# Patient Record
Sex: Male | Born: 1945 | Race: White | Hispanic: No | Marital: Married | Smoking: Former smoker
Health system: Southern US, Community
[De-identification: ages and names within clinical notes are randomized; demographics above are authoritative.]

## PROBLEM LIST (undated history)

## (undated) DIAGNOSIS — E7800 Pure hypercholesterolemia, unspecified: Secondary | ICD-10-CM

## (undated) DIAGNOSIS — I219 Acute myocardial infarction, unspecified: Secondary | ICD-10-CM

## (undated) DIAGNOSIS — I10 Essential (primary) hypertension: Secondary | ICD-10-CM

## (undated) HISTORY — PX: CARDIAC CATHETERIZATION: SHX172

## (undated) HISTORY — PX: KIDNEY STONE SURGERY: SHX686

## (undated) HISTORY — PX: OTHER SURGICAL HISTORY: SHX169

## (undated) HISTORY — PX: SHOULDER SURGERY: SHX246

## (undated) HISTORY — PX: SMALL INTESTINE SURGERY: SHX150

## (undated) HISTORY — PX: HERNIA REPAIR: SHX51

## (undated) HISTORY — PX: BASAL CELL CARCINOMA EXCISION: SHX1214

## (undated) HISTORY — PX: BACK SURGERY: SHX140

## (undated) HISTORY — PX: CERVICAL SPINE SURGERY: SHX589

---

## 2004-01-13 ENCOUNTER — Inpatient Hospital Stay (HOSPITAL_COMMUNITY): Admission: RE | Admit: 2004-01-13 | Discharge: 2004-01-14 | Payer: Self-pay | Admitting: Neurosurgery

## 2004-01-25 ENCOUNTER — Ambulatory Visit (HOSPITAL_COMMUNITY): Admission: RE | Admit: 2004-01-25 | Discharge: 2004-01-25 | Payer: Self-pay | Admitting: Neurosurgery

## 2006-04-17 ENCOUNTER — Ambulatory Visit (HOSPITAL_COMMUNITY): Admission: RE | Admit: 2006-04-17 | Discharge: 2006-04-18 | Payer: Self-pay | Admitting: Neurosurgery

## 2009-07-28 ENCOUNTER — Encounter: Admission: RE | Admit: 2009-07-28 | Discharge: 2009-07-28 | Payer: Self-pay | Admitting: Unknown Physician Specialty

## 2010-06-21 ENCOUNTER — Inpatient Hospital Stay (HOSPITAL_COMMUNITY)
Admission: RE | Admit: 2010-06-21 | Discharge: 2010-06-22 | Payer: Self-pay | Source: Home / Self Care | Attending: Neurosurgery | Admitting: Neurosurgery

## 2010-06-21 LAB — TYPE AND SCREEN
ABO/RH(D): AB POS
Antibody Screen: NEGATIVE

## 2010-08-28 LAB — BASIC METABOLIC PANEL
BUN: 17 mg/dL (ref 6–23)
CO2: 27 mEq/L (ref 19–32)
Calcium: 9.7 mg/dL (ref 8.4–10.5)
Chloride: 104 mEq/L (ref 96–112)
Creatinine, Ser: 1 mg/dL (ref 0.4–1.5)
GFR calc Af Amer: >60 mL/min (ref >60)
GFR calc non Af Amer: >60 mL/min (ref >60)
Glucose, Bld: 93 mg/dL (ref 70–99)
Potassium: 5 mEq/L (ref 3.5–5.1)
Sodium: 139 mEq/L (ref 135–145)

## 2010-08-28 LAB — CBC
HCT: 47.2 % (ref 39.0–52.0)
Hemoglobin: 15.9 g/dL (ref 13.0–17.0)
MCH: 31.1 pg (ref 26.0–34.0)
MCHC: 33.7 g/dL (ref 30.0–36.0)
MCV: 92.2 fL (ref 78.0–100.0)
Platelets: 242 10*3/uL (ref 150–400)
RBC: 5.12 MIL/uL (ref 4.22–5.81)
RDW: 12.6 % (ref 11.5–15.5)
WBC: 8 10*3/uL (ref 4.0–10.5)

## 2010-08-28 LAB — TYPE AND SCREEN
ABO/RH(D): AB POS
Antibody Screen: NEGATIVE

## 2010-08-28 LAB — SURGICAL PCR SCREEN
MRSA, PCR: NEGATIVE
Staphylococcus aureus: POSITIVE — AB

## 2010-08-28 LAB — ABO/RH: ABO/RH(D): AB POS

## 2010-11-03 NOTE — Op Note (Signed)
NAME:  David Hester, David Hester                       ACCOUNT NO.:  [id number]   MEDICAL RECORD NO.:  [id number]                   PATIENT TYPE:  INP   LOCATION:  2550                                 FACILITY:  [facility]   PHYSICIAN:  Hewitt Shorts, M.D.            DATE OF BIRTH:  1946/04/22   DATE OF PROCEDURE:  01/13/2004  DATE OF DISCHARGE:                                 OPERATIVE REPORT   PREOPERATIVE DIAGNOSES:  1. 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  2. Left L5-S1 extraforaminal lumbar disc herniation.  3. Lumbar degenerative disc disease.  4. Lumbar spondylosis.   POSTOPERATIVE DIAGNOSES:  1. 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  2. Left L5-S1 extraforaminal lumbar disc herniation.  3. Lumbar degenerative disc disease.  4. Lumbar spondylosis.   PROCEDURE:  Left L4-5 lumbar laminotomy and foraminotomy and left L5-S1  lumbar extraforaminal microdiscectomy with microdissection.   SURGEON:  Hewitt Shorts, M.D.   ASSISTANT:  Lovell Sheehan.   ANESTHESIA:  General endotracheal anesthesia.   INDICATIONS FOR PROCEDURE:  This is a 65 year old man who presented with  left L5 lumbar radiculopathy with weakness of the left extensor hallucis  longus, decision made to proceed with decompression as described above.   DESCRIPTION OF PROCEDURE:  The patient brought to the operating room, placed  under general endotracheal anesthesia.  The patient was turned to prone  position.  Lumbar region was prepped with DuraPrep and draped in sterile  fashion.  The midline was infiltrated with local anesthetic with epinephrine  and x-ray was taken. The L4-5 and L5-S1 level was identified.  The midline  incision was made and carried down through the subcutaneous tissue.  Bipolar  cautery and electrocautery was used to maintain hemostasis.  Dissection was  carried down to the lumbar fascia which was incised in the left side of  midline in the paraspinal muscle with dissection of the spinous process and  lamina in subperiosteal fashion.  The L4-5 and L5-S1 intralaminar spaces  were identified.  An x-ray was taken to confirm the localization and then  the microscope was draped and brought into the field to provide additional  magnification, illumination and visualization and the remainder of the  decompression was performed using microdissection, microsurgical technique.  A laminotomy was performed at the L4-5 level using the X-Max drill and  Kerrison punches ligamentum flavum which was thick and was carefully removed  and the thecal sac and exiting left L5 nerve root were identified.  The  foraminotomy was performed through the left L5 nerve root and good  decompression of the thecal sac and nerve root was achieved.  Hemostasis was  established with the use of Gelfoam soaked in thrombin which was removed  prior to closure.  We then  performed a left L5-S1 extraforaminal  microdiscectomy.  Dissection was carried over the facet complex into the  extraforaminal space.  Overlying soft tissues were carefully removed.  The  lateral facetectomy was  performed and we were able to identify the exiting  left L5 nerve root.  This was gently retracted superior laterally and we  were able to expose the disc herniation.  The remaining annular fibers were  incised and disc herniation removed and we were able to decompress the  exiting left L5 nerve root.  In the end a thorough discectomy was performed  with removal of all loose fragments of disc material and good decompression  of that nerve root.  Hemostasis was established with the use of bipolar  cautery and once hemostasis was established from both surgical areas, we  filled 2 mL of fentanyl and 80 mg of Depo-Medrol about half of it in the  left L4-5 laminotomy and then the other half of it in the left L5-S1  extraforaminal space.  We then proceed with closure of the deep fascia,  closed with interrupted undyed #1 Vicryl suture, the subcutaneous and   subcuticular were closed with inverted 2-0 Vicryl undyed sutures.  The skin  was reapproximated with Dermabond.  The patient tolerated the procedure  well.  The estimated blood loss was 50 mL.  Sponge and needle counts were  correct.  Following surgery, the patient was turned back to supine position.  We reversed the anesthetic, extubated and transferred the patient to the  recovery room for further care.                                               Hewitt Shorts, M.D.    RWN/MEDQ  D:  01/13/2004  T:  01/13/2004  Job:  [id number]

## 2010-11-03 NOTE — Op Note (Signed)
NAME:  JACOBEY, GURA NO.:  [id number]   MEDICAL RECORD NO.:  [id number]          PATIENT TYPE:  INP   LOCATION:  3017                         FACILITY:  [facility]   PHYSICIAN:  Hewitt Shorts, M.D.DATE OF BIRTH:  Feb 22, 1946   DATE OF PROCEDURE:  04/17/2006  DATE OF DISCHARGE:  04/18/2006                                 OPERATIVE REPORT   PREOPERATIVE DIAGNOSIS:  1. C5-6 and C6-7 cervical disk herniation.  2. Cervical spondylosis.  3. Cervical degenerative disk disease.  4. Cervical radiculopathy.   POSTOPERATIVE DIAGNOSIS:  1. C5-6 and C6-7 cervical disk herniation.  2. Cervical spondylosis.  3. Cervical degenerative disk disease.  4. Cervical radiculopathy.   PROCEDURE:  C5-6 and C6-7 anterior cervical diskectomy arthrodesis with VD2  allograft and Tether cervical plating.   SURGEON:  Hewitt Shorts, M.D.   ASSISTANT:  Coletta Memos, M.D.   ANESTHESIA:  General endotracheal.   INDICATIONS:  This is a 65 year old man who presented with a left cervical  radiculopathy.  He was felt to have cervical spondylosis and degenerative  disk disease with superimposed disk herniation with no root compression.  The decision was made to proceed with two-level anterior cervical diskectomy  arthrodesis.   PROCEDURE:  The patient was brought to the operating room and placed under  general endotracheal anesthesia.  The patient was placed in 10 pounds of  Halter traction.  The neck was prepped with Betadine and soap solution and  draped in a sterile fashion.  A horizontal incision was made on the left  side of the neck.  The line of the incision was infiltrated with local  anesthetic with epinephrine.   An incision was made and carried down through the subcutaneous tissue.  Bipolar cautery and electrocautery was utilized for hemostasis.  Dissection  was then carried down to the platysma.  Then through avascular plane, we  moved the cervical, mastoid, carotid  artery and jugular vein laterally and  tracheal and esophagus medially.  The ventral aspect of the vertebral column  was identified and localizing x-rays were taken.  The C5-6 and C6-7  intervertebral disk spaces were identified.  Diskectomy was begun with an  incision at the annulus and continued with microcurettes and pituitary  rongeurs.  Anterior osteophytic overgrowths were removed using the Ex Max  drill and double-action rongeurs.  Diskectomy was continued with  microcurettes and pituitary rongeurs and then microscope was draped and  brought into the field to provide additional magnification, illumination and  visualization.  The remainder of the decompression was performed using  microdissection and microsurgical technique.   The cartilaginous endplates of the vertebral bodies were prepared using  microcurettes and the Ex Max drill.  Then dissection was carried out  posteriorly where there was significant osteophytic overgrowth.  This was  carefully removed using the Ex Max drill along with a 2-mm Kerrison punch  with a thin foot plate.  The posterior longitudinal ligament was carefully  removed and we were able to decompress the spinal canal and thecal sac.   We then turned our attention to the neural foramina where  there was  significant spondylitic encroachment with superimposed disk herniation and  this was carefully removed, decompressing the neural foramina and the  exiting nerve roots.  Once decompression was completed, hemostasis was  established with the use of Gelfoam soaked in thrombin.  Then we measured  the height of the intervertebral disk spaces and selected 8 mm in height  interbody bone grafts.  Each was hydrated in saline solution and then  carefully positioned in the intervertebral disk space and countersunk.  We  then discontinued the cervical traction and selected a 35-mm Tether cervical  plate.  It was positioned over the fusion construct and secured with 4 x  13  mm variable angle screws.  We placed a pair of screws at C5, another pair at  C7 and a single screw at C6.  All the screw holes were drilled, tapped and  the screws placed in alternating fashion.  All five screws had final  tightening done and x-rays taken which showed the graft plate and screws all  in good position.  Then the wound was irrigated with bacitracin solution,  checked for hemostasis, which was established and confirmed.  An x-ray was  taken, which confirmed the positioning.  Then we proceed with closure.   The platysma was closed with interrupted, inverted 2-0 Vicryl sutures.  The  subcutaneous and subcuticular were closed with interrupted, inverted 3-0  Vicryl sutures.  The skin was approximated with Dermabond.  The procedure  was tolerated well.   ESTIMATED BLOOD LOSS:  The estimated blood loss was 50 cc.   COUNTS:  Sponge counts were correct.   DISPOSITION:  Following surgery the patient was placed in a soft cervical  collar, reversed from anesthetic, extubated and transferred to the recovery  room for further care where he was noted to be moving all four extremities  to command.      Hewitt Shorts, M.D.  Electronically Signed     RWN/MEDQ  D:  04/17/2006  T:  04/18/2006  Job:  [id number]

## 2011-03-02 ENCOUNTER — Other Ambulatory Visit (HOSPITAL_COMMUNITY): Payer: Self-pay

## 2011-03-07 ENCOUNTER — Encounter (HOSPITAL_COMMUNITY)
Admission: RE | Admit: 2011-03-07 | Discharge: 2011-03-07 | Disposition: A | Discharge status: Home / Self Care | Payer: 59 | Source: Ambulatory Visit | Attending: Neurosurgery | Admitting: Neurosurgery

## 2011-03-07 LAB — CBC
HCT: 42.4 % (ref 39.0–52.0)
Hemoglobin: 14.4 g/dL (ref 13.0–17.0)
MCH: 29.6 pg (ref 26.0–34.0)
MCHC: 34 g/dL (ref 30.0–36.0)
MCV: 87.2 fL (ref 78.0–100.0)
Platelets: 238 10*3/uL (ref 150–400)
RBC: 4.86 MIL/uL (ref 4.22–5.81)
RDW: 13.6 % (ref 11.5–15.5)
WBC: 6.7 10*3/uL (ref 4.0–10.5)

## 2011-03-07 LAB — BASIC METABOLIC PANEL
BUN: 18 mg/dL (ref 6–23)
CO2: 28 mEq/L (ref 19–32)
Calcium: 9.8 mg/dL (ref 8.4–10.5)
Chloride: 101 mEq/L (ref 96–112)
Creatinine, Ser: 1.02 mg/dL (ref 0.50–1.35)
GFR calc Af Amer: >60 mL/min (ref >60)
GFR calc non Af Amer: >60 mL/min (ref >60)
Glucose, Bld: 101 mg/dL — ABNORMAL HIGH (ref 70–99)
Potassium: 4.4 mEq/L (ref 3.5–5.1)
Sodium: 138 mEq/L (ref 135–145)

## 2011-03-07 LAB — SURGICAL PCR SCREEN
MRSA, PCR: NEGATIVE
Staphylococcus aureus: POSITIVE — AB

## 2011-03-12 ENCOUNTER — Ambulatory Visit (HOSPITAL_COMMUNITY): Payer: 59

## 2011-03-12 ENCOUNTER — Ambulatory Visit (HOSPITAL_COMMUNITY)
Admission: RE | Admit: 2011-03-12 | Discharge: 2011-03-13 | Disposition: A | Discharge status: Home / Self Care | Payer: 59 | Source: Ambulatory Visit | Attending: Neurosurgery | Admitting: Neurosurgery

## 2011-03-12 DIAGNOSIS — G4733 Obstructive sleep apnea (adult) (pediatric): Secondary | ICD-10-CM | POA: Insufficient documentation

## 2011-03-12 DIAGNOSIS — IMO0002 Reserved for concepts with insufficient information to code with codable children: Secondary | ICD-10-CM | POA: Insufficient documentation

## 2011-03-12 DIAGNOSIS — M47812 Spondylosis without myelopathy or radiculopathy, cervical region: Secondary | ICD-10-CM | POA: Insufficient documentation

## 2011-03-12 DIAGNOSIS — G9741 Accidental puncture or laceration of dura during a procedure: Secondary | ICD-10-CM | POA: Insufficient documentation

## 2011-03-12 DIAGNOSIS — Z01812 Encounter for preprocedural laboratory examination: Secondary | ICD-10-CM | POA: Insufficient documentation

## 2011-03-12 DIAGNOSIS — I10 Essential (primary) hypertension: Secondary | ICD-10-CM | POA: Insufficient documentation

## 2011-03-12 DIAGNOSIS — Y9210 Unspecified residential institution as the place of occurrence of the external cause: Secondary | ICD-10-CM | POA: Insufficient documentation

## 2011-03-12 DIAGNOSIS — I2510 Atherosclerotic heart disease of native coronary artery without angina pectoris: Secondary | ICD-10-CM | POA: Insufficient documentation

## 2011-03-13 NOTE — Op Note (Signed)
NAME:  RALF, KONOPKA NO.:  [id number]  MEDICAL RECORD NO.:  [id number]  LOCATION:  SDSC                         FACILITY:  [facility]  PHYSICIAN:  Hewitt Shorts, M.D.DATE OF BIRTH:  July 27, 1945  DATE OF PROCEDURE:  03/12/2011 DATE OF DISCHARGE:                              OPERATIVE REPORT   PREOPERATIVE DIAGNOSIS:  Cervical stenosis, cervical spondylosis.  POSTOPERATIVE DIAGNOSIS:  Cervical stenosis, cervical spondylosis.  PROCEDURE:  C3 and C4 posterior cervical laminectomy, C3-4 posterior cervical arthrodesis with viewpoint lateral mass screws and rods and locally harvested morselized autograft with the operating microscope and microdissection and microsurgical technique.  SURGEON:  Hewitt Shorts, MD  ASSISTANT:  Danae Orleans. Venetia Maxon, MD  ANESTHESIA:  General endotracheal.  INDICATIONS:  The patient is a 65 year old man who was found to have cervical stenosis at the C3-4 level with significant thickening of the ligamentum flavum causing dorsal compression.  Decision was made to proceed with decompression and stabilization.  PROCEDURE:  The patient was brought to the operating room and placed under general endotracheal anesthesia.  The patient was placed in three- pin Mayfield head holder and turned to a prone position.  The neck was prepped with Betadine soap and solution and draped in a sterile fashion. Using C-arm fluoroscopy, the C3-4 level was identified, the midline incision was infiltrated with local anesthetic with epinephrine.  The midline incision was made and carried down through the subcutaneous tissue.  Bipolar cautery with electrocautery was used to maintain hemostasis.  Dissection was carried down to the posterior cervical fascia which was incised bilaterally to the paracervical musculature with dissection towards the spinous process of the lamina in subperiosteal fashion.  Again, using C-arm fluoroscopic guidance, we localized the  C3-4 interlaminar space and the dissection was then carried out over the facet complex and lateral mass at C3-4.  Then, we first placed lateral mass screws bilaterally at C3 and C4 with C-arm fluoroscopic guidance.  A pilot hole was first made with a high-speed drill and then drill hole was made for the screw.  We tapped the posterior cortex and then placed a 3.5 x 14 mm polyaxial screws bilaterally at each level.  After each lateral mass was drilled, it was examined with a ball probe and good bony surfaces were found prior to placing the screws.  We then cut a rod to proper length and placed a segment of rod in each side within the screw heads and then locking caps were placed and final tightening was done against a counter torque.  We then proceeded with the laminectomy using a double-action rongeur, removed the spinous process in the superficial aspect of the lamina. This bone was saved, cleaned of soft tissue and morselized later to be used as our autograft.  We then continued the laminectomy using high- speed drill and 2 and 3-mm Kerrison punches with thin footplates.  The thickened ligamentum flavum at the C3-4 level was adherent to the dura and a tear in the dura did occur along the left side of the dorsal aspect at that level.  This was closed with 6-0 Prolene suture.  We used one figure-of-eight suture, one simple straight suture, and a Duragen was  placed over the dural repair.  The decompression was completed removing the thickened and partially calcified ligamentum flavum.  Good decompression of the thecal sac was achieved.  Good hemostasis was established and then we packed the facets and lateral masses with locally harvested morselized autograft and then proceeded with closure. Paraspinal muscle reapproximated with interrupted undyed 1 Vicryl suture, deep fascia closed with interrupted 1 Vicryl sutures, the subcutaneous and subcuticular were closed with interrupted and  inverted 2-0 undyed Vicryl suture and the skin was reapproximated with Dermabond. Procedure was tolerated well.  The estimated blood loss was 75 mL. Sponge and needle count correct.  Following surgery, the patient was turned back to a supine position.  His in-and-out catheter drained for 200 mL.  He was then reversed from the anesthetic, extubated, and transferred to the recovery room for further care where he was resting comfortably and moving all four extremities with good strength.     Hewitt Shorts, M.D.     RWN/MEDQ  D:  03/12/2011  T:  03/12/2011  Job:  [id number]  Electronically Signed by Shirlean Kelly M.D. on 03/13/2011 01:58:17 PM

## 2013-07-31 ENCOUNTER — Ambulatory Visit: Admit: 2013-07-31 | Payer: Self-pay | Admitting: Orthopedic Surgery

## 2013-07-31 ENCOUNTER — Ambulatory Visit (HOSPITAL_BASED_OUTPATIENT_CLINIC_OR_DEPARTMENT_OTHER)
Admission: EM | Admit: 2013-07-31 | Discharge: 2013-07-31 | Disposition: A | Discharge status: Home / Self Care | Payer: Worker's Compensation | Attending: Emergency Medicine | Admitting: Emergency Medicine

## 2013-07-31 ENCOUNTER — Encounter (HOSPITAL_BASED_OUTPATIENT_CLINIC_OR_DEPARTMENT_OTHER): Payer: Self-pay | Admitting: Emergency Medicine

## 2013-07-31 ENCOUNTER — Encounter (HOSPITAL_COMMUNITY): Payer: Worker's Compensation | Admitting: Anesthesiology

## 2013-07-31 ENCOUNTER — Other Ambulatory Visit: Payer: Self-pay | Admitting: Orthopedic Surgery

## 2013-07-31 ENCOUNTER — Encounter (HOSPITAL_COMMUNITY)
Admission: EM | Disposition: A | Discharge status: Home / Self Care | Payer: Self-pay | Source: Home / Self Care | Attending: Emergency Medicine

## 2013-07-31 ENCOUNTER — Emergency Department (HOSPITAL_COMMUNITY): Payer: Worker's Compensation | Admitting: Anesthesiology

## 2013-07-31 ENCOUNTER — Emergency Department (HOSPITAL_BASED_OUTPATIENT_CLINIC_OR_DEPARTMENT_OTHER): Payer: Worker's Compensation

## 2013-07-31 DIAGNOSIS — S62639B Displaced fracture of distal phalanx of unspecified finger, initial encounter for open fracture: Secondary | ICD-10-CM | POA: Insufficient documentation

## 2013-07-31 DIAGNOSIS — IMO0002 Reserved for concepts with insufficient information to code with codable children: Secondary | ICD-10-CM | POA: Insufficient documentation

## 2013-07-31 DIAGNOSIS — S68119A Complete traumatic metacarpophalangeal amputation of unspecified finger, initial encounter: Secondary | ICD-10-CM

## 2013-07-31 DIAGNOSIS — Z7982 Long term (current) use of aspirin: Secondary | ICD-10-CM | POA: Insufficient documentation

## 2013-07-31 DIAGNOSIS — I252 Old myocardial infarction: Secondary | ICD-10-CM | POA: Insufficient documentation

## 2013-07-31 DIAGNOSIS — IMO0001 Reserved for inherently not codable concepts without codable children: Secondary | ICD-10-CM

## 2013-07-31 DIAGNOSIS — I10 Essential (primary) hypertension: Secondary | ICD-10-CM | POA: Insufficient documentation

## 2013-07-31 DIAGNOSIS — E7800 Pure hypercholesterolemia, unspecified: Secondary | ICD-10-CM | POA: Insufficient documentation

## 2013-07-31 DIAGNOSIS — Y9269 Other specified industrial and construction area as the place of occurrence of the external cause: Secondary | ICD-10-CM | POA: Insufficient documentation

## 2013-07-31 DIAGNOSIS — Y990 Civilian activity done for income or pay: Secondary | ICD-10-CM | POA: Insufficient documentation

## 2013-07-31 DIAGNOSIS — W319XXA Contact with unspecified machinery, initial encounter: Secondary | ICD-10-CM | POA: Insufficient documentation

## 2013-07-31 DIAGNOSIS — S61209A Unspecified open wound of unspecified finger without damage to nail, initial encounter: Secondary | ICD-10-CM | POA: Insufficient documentation

## 2013-07-31 HISTORY — DX: Essential (primary) hypertension: I10

## 2013-07-31 HISTORY — PX: I&D EXTREMITY: SHX5045

## 2013-07-31 HISTORY — DX: Pure hypercholesterolemia, unspecified: E78.00

## 2013-07-31 HISTORY — DX: Acute myocardial infarction, unspecified: I21.9

## 2013-07-31 SURGERY — IRRIGATION AND DEBRIDEMENT EXTREMITY
Anesthesia: Monitor Anesthesia Care | Site: Hand | Laterality: Right

## 2013-07-31 MED ORDER — CEFAZOLIN SODIUM 1-5 GM-% IV SOLN
1.0000 g | Freq: Once | INTRAVENOUS | Status: AC
  Administered 2013-07-31: 1 g via INTRAVENOUS
  Filled 2013-07-31: qty 50

## 2013-07-31 MED ORDER — SODIUM BICARBONATE 4 % IV SOLN
INTRAVENOUS | Status: DC | PRN
  Administered 2013-07-31: 1 mL via INTRAVENOUS

## 2013-07-31 MED ORDER — CHLORHEXIDINE GLUCONATE 4 % EX LIQD
60.0000 mL | Freq: Once | CUTANEOUS | Status: DC
  Filled 2013-07-31: qty 60

## 2013-07-31 MED ORDER — SODIUM CHLORIDE 0.9 % IR SOLN
Status: DC | PRN
  Administered 2013-07-31: 3000 mL

## 2013-07-31 MED ORDER — BUPIVACAINE HCL (PF) 0.25 % IJ SOLN
INTRAMUSCULAR | Status: AC
  Filled 2013-07-31: qty 30

## 2013-07-31 MED ORDER — OXYCODONE-ACETAMINOPHEN 5-325 MG PO TABS: 1.0000 | ORAL_TABLET | ORAL | Status: DC | PRN

## 2013-07-31 MED ORDER — LIDOCAINE-EPINEPHRINE 1 %-1:100000 IJ SOLN
INTRAMUSCULAR | Status: DC | PRN
  Administered 2013-07-31: 20 mL

## 2013-07-31 SURGICAL SUPPLY — 53 items
BANDAGE CONFORM 2  STR LF (GAUZE/BANDAGES/DRESSINGS) IMPLANT
BANDAGE ELASTIC 3 VELCRO ST LF (GAUZE/BANDAGES/DRESSINGS) ×3 IMPLANT
BANDAGE ELASTIC 4 VELCRO ST LF (GAUZE/BANDAGES/DRESSINGS) ×3 IMPLANT
BANDAGE GAUZE 4  KLING STR (GAUZE/BANDAGES/DRESSINGS) ×2 IMPLANT
BANDAGE GAUZE ELAST BULKY 4 IN (GAUZE/BANDAGES/DRESSINGS) ×6 IMPLANT
BNDG CMPR 9X4 STRL LF SNTH (GAUZE/BANDAGES/DRESSINGS)
BNDG COHESIVE 1X5 TAN STRL LF (GAUZE/BANDAGES/DRESSINGS) ×2 IMPLANT
BNDG ESMARK 4X9 LF (GAUZE/BANDAGES/DRESSINGS) IMPLANT
CLOTH BEACON ORANGE TIMEOUT ST (SAFETY) ×3 IMPLANT
CORDS BIPOLAR (ELECTRODE) IMPLANT
COVER SURGICAL LIGHT HANDLE (MISCELLANEOUS) ×6 IMPLANT
CUFF TOURNIQUET SINGLE 18IN (TOURNIQUET CUFF) ×3 IMPLANT
DECANTER SPIKE VIAL GLASS SM (MISCELLANEOUS) ×3 IMPLANT
DRAIN PENROSE 1/4X12 LTX STRL (WOUND CARE) IMPLANT
DRAPE OEC MINIVIEW 54X84 (DRAPES) IMPLANT
DRAPE SURG 17X23 STRL (DRAPES) ×3 IMPLANT
DRSG PAD ABDOMINAL 8X10 ST (GAUZE/BANDAGES/DRESSINGS) ×6 IMPLANT
DURAPREP 26ML APPLICATOR (WOUND CARE) IMPLANT
ELECT REM PT RETURN 9FT ADLT (ELECTROSURGICAL)
ELECTRODE REM PT RTRN 9FT ADLT (ELECTROSURGICAL) IMPLANT
GAUZE PACKING IODOFORM 1/4X5 (PACKING) IMPLANT
GAUZE XEROFORM 1X8 LF (GAUZE/BANDAGES/DRESSINGS) ×3 IMPLANT
GLOVE BIO SURGEON STRL SZ8.5 (GLOVE) ×3 IMPLANT
GOWN PREVENTION PLUS XLARGE (GOWN DISPOSABLE) ×3 IMPLANT
GOWN STRL NON-REIN LRG LVL3 (GOWN DISPOSABLE) ×3 IMPLANT
HANDPIECE INTERPULSE COAX TIP (DISPOSABLE)
KIT BASIN OR (CUSTOM PROCEDURE TRAY) ×3 IMPLANT
KIT ROOM TURNOVER OR (KITS) ×3 IMPLANT
MANIFOLD NEPTUNE II (INSTRUMENTS) ×3 IMPLANT
NDL HYPO 25GX1X1/2 BEV (NEEDLE) IMPLANT
NDL HYPO 25X1 1.5 SAFETY (NEEDLE) ×1 IMPLANT
NEEDLE HYPO 25GX1X1/2 BEV (NEEDLE) IMPLANT
NEEDLE HYPO 25X1 1.5 SAFETY (NEEDLE) ×3 IMPLANT
NS IRRIG 1000ML POUR BTL (IV SOLUTION) ×3 IMPLANT
PACK ORTHO EXTREMITY (CUSTOM PROCEDURE TRAY) ×3 IMPLANT
PAD ARMBOARD 7.5X6 YLW CONV (MISCELLANEOUS) ×6 IMPLANT
PAD CAST 4YDX4 CTTN HI CHSV (CAST SUPPLIES) ×2 IMPLANT
PADDING CAST COTTON 4X4 STRL (CAST SUPPLIES) ×6
SET HNDPC FAN SPRY TIP SCT (DISPOSABLE) IMPLANT
SPONGE GAUZE 4X4 12PLY (GAUZE/BANDAGES/DRESSINGS) ×4 IMPLANT
SPONGE LAP 18X18 X RAY DECT (DISPOSABLE) ×3 IMPLANT
SUCTION FRAZIER TIP 10 FR DISP (SUCTIONS) ×3 IMPLANT
SUT VICRYL RAPIDE 4/0 PS 2 (SUTURE) ×2 IMPLANT
SYR 20CC LL (SYRINGE) IMPLANT
SYR CONTROL 10ML LL (SYRINGE) ×3 IMPLANT
TOWEL OR 17X24 6PK STRL BLUE (TOWEL DISPOSABLE) ×3 IMPLANT
TOWEL OR 17X26 10 PK STRL BLUE (TOWEL DISPOSABLE) ×3 IMPLANT
TUBE ANAEROBIC SPECIMEN COL (MISCELLANEOUS) IMPLANT
TUBE CONNECTING 12'X1/4 (SUCTIONS) ×1
TUBE CONNECTING 12X1/4 (SUCTIONS) ×2 IMPLANT
UNDERPAD 30X30 INCONTINENT (UNDERPADS AND DIAPERS) ×3 IMPLANT
WATER STERILE IRR 1000ML POUR (IV SOLUTION) ×3 IMPLANT
YANKAUER SUCT BULB TIP NO VENT (SUCTIONS) ×3 IMPLANT

## 2013-07-31 NOTE — Progress Notes (Signed)
Spoke to Dr. Tobias Alexander regarding patient. Preop orders given

## 2013-07-31 NOTE — Preoperative (Signed)
Beta Blockers   Reason not to administer Beta Blockers:Not Applicable 

## 2013-07-31 NOTE — ED Notes (Signed)
Pt caught right 2nd finger in a loom at work, tearing off fingernail.

## 2013-07-31 NOTE — ED Provider Notes (Signed)
CSN: [id number]     Arrival date & time 07/31/13  1245 History   First MD Initiated Contact with Patient 07/31/13 1329     Chief Complaint  Patient presents with  . Finger Injury     (Consider location/radiation/quality/duration/timing/severity/associated sxs/prior Treatment) HPI Comments: Pt got finger caught in a moving weaving loom. He states it "pulled his nail off".    Patient is a 68 y.o. male presenting with hand pain. The history is provided by the patient. No language interpreter was used.  Hand Pain This is a new problem. The current episode started less than 1 hour ago. The problem occurs constantly. The problem has not changed since onset.Pertinent negatives include no chest pain, no abdominal pain, no headaches and no shortness of breath. Nothing aggravates the symptoms. He has tried rest for the symptoms. The treatment provided no relief.    Past Medical History  Diagnosis Date  . Hypertension   . High cholesterol   . MI (myocardial infarction)    Past Surgical History  Procedure Laterality Date  . Back surgery    . Shoulder surgery     No family history on file. History  Substance Use Topics  . Smoking status: Never Smoker   . Smokeless tobacco: Not on file  . Alcohol Use: No    Review of Systems  Constitutional: Negative for fever, activity change, appetite change and fatigue.  HENT: Negative for congestion, facial swelling, rhinorrhea and trouble swallowing.   Eyes: Negative for photophobia and pain.  Respiratory: Negative for cough, chest tightness and shortness of breath.   Cardiovascular: Negative for chest pain and leg swelling.  Gastrointestinal: Negative for nausea, vomiting, abdominal pain, diarrhea and constipation.  Endocrine: Negative for polydipsia and polyuria.  Genitourinary: Negative for dysuria, urgency, decreased urine volume and difficulty urinating.  Musculoskeletal: Negative for back pain and gait problem.  Skin: Negative for color  change, rash and wound.  Allergic/Immunologic: Negative for immunocompromised state.  Neurological: Negative for dizziness, facial asymmetry, speech difficulty, weakness, numbness and headaches.  Psychiatric/Behavioral: Negative for confusion, decreased concentration and agitation.      Allergies  Ivp dye and Sulfa antibiotics  Home Medications   Current Outpatient Rx  Name  Route  Sig  Dispense  Refill  . aspirin 81 MG tablet   Oral   Take 81 mg by mouth daily.         Marland Kitchen ezetimibe (ZETIA) 10 MG tablet   Oral   Take 10 mg by mouth every other day.         . lisinopril (PRINIVIL,ZESTRIL) 2.5 MG tablet   Oral   Take 2.5 mg by mouth daily.         Marland Kitchen lovastatin (MEVACOR) 10 MG tablet   Oral   Take 10 mg by mouth every other day.         . zolpidem (AMBIEN) 5 MG tablet   Oral   Take 5 mg by mouth at bedtime as needed for sleep (Pt sts he breaks the tab into "fourths" and does not take every night.).          BP 137/70  Pulse 88  Temp(Src) 98.1 F (36.7 C) (Oral)  Resp 16  Ht 5\' 10"  (1.778 m)  Wt 178 lb (80.74 kg)  BMI 25.54 kg/m2  SpO2 98% Physical Exam  Constitutional: He is oriented to person, place, and time. He appears well-developed and well-nourished. No distress.  HENT:  Head: Normocephalic and atraumatic.  Mouth/Throat: No  oropharyngeal exudate.  Eyes: Pupils are equal, round, and reactive to light.  Neck: Normal range of motion. Neck supple.  Cardiovascular: Normal rate, regular rhythm and normal heart sounds.  Exam reveals no gallop and no friction rub.   No murmur heard. Pulmonary/Chest: Effort normal and breath sounds normal. No respiratory distress. He has no wheezes. He has no rales.  Abdominal: Soft. Bowel sounds are normal. He exhibits no distension and no mass. There is no tenderness. There is no rebound and no guarding.  Musculoskeletal: Normal range of motion. He exhibits no edema and no tenderness.       Right hand: He exhibits bony  tenderness, deformity and laceration. He exhibits normal range of motion. Normal sensation noted. Normal strength noted.       Hands: Neurological: He is alert and oriented to person, place, and time.  Skin: Skin is warm and dry.  Psychiatric: He has a normal mood and affect.    ED Course  Procedures (including critical care time) Labs Review Labs Reviewed - No data to display Imaging Review Dg Finger Index Right  07/31/2013   CLINICAL DATA:  Index finger caught between machinery at work  EXAM: RIGHT INDEX FINGER 2+V  COMPARISON:  None.  FINDINGS: There has been traumatic amputation of the distal aspect of the tuft of the index finger. There is a nondisplaced oblique fracture involving the distal phalanx of the index finger. Punctate well corticated osseous structure adjacent to the radial base of the distal phalanx is favored to be the sequela of either degenerative change or remote avulsive injury. No definitive intra-articular extension. Expected adjacent soft tissue swelling and subcutaneous emphysema. No definite radiopaque foreign body. Joint spaces appear preserved. No definite erosions.  IMPRESSION: 1. 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 2. Oblique nondisplaced fracture of the distal phalanx of the index finger without definite intra-articular extension. 3. Expected adjacent soft tissue swelling and subcutaneous emphysema. No definite radiopaque foreign body.   Electronically Signed   By: Sandi Mariscal M.D.   On: 07/31/2013 14:19    EKG Interpretation   None       MDM   Final diagnoses:  Traumatic amputation of fingertip  Nailbed injury  Open fracture of distal phalanx of second finger of right hand    Pt is a 68 y.o. male with Pmhx as above who presents with injury to R pointer finger after he got caught in a weaving loom at work.  He has a complex laceration of fingertip with a near amputation of his distal fingertip which is attached to his nearly  avulsed fingernail.  Sensation still present in distal fingertip.  DIP does not appear injured.  Digital block done. Waterproof band-aid removed from fingertip.  Wound lightly irrigated.  XR shows tuft fx as well as obliquely oriented distal phalanx fracture. Pt given 1g IV ancef. Tdap UTD. Dr. Burney Gauze with hand surgery will accept the pt to Short Stay A for outpt surgical repair. Wound dressed w/ Xeroform gauze, bulky dressing and removable aluminum splint. Family will drive him to [facility].  NPO.         Neta Ehlers, MD 07/31/13 [phone]

## 2013-07-31 NOTE — Discharge Instructions (Signed)
Fingertip Injuries and Amputations Fingertip injuries are common and often get injured because they are last to escape when pulling your hand out of harm's way. You have amputated (cut off) part of your finger. How this turns out depends largely on how much was amputated. If just the tip is amputated, often the end of the finger will grow back and the finger may return to much the same as it was before the injury.  If more of the finger is missing, your caregiver has done the best with the tissue remaining to allow you to keep as much finger as is possible. Your caregiver after checking your injury has tried to leave you with a painless fingertip that has durable, feeling skin. If possible, your caregiver has tried to maintain the finger's length and appearance and preserve its fingernail.  Please read the instructions outlined below and refer to this sheet in the next few weeks. These instructions provide you with general information on caring for yourself. Your caregiver may also give you specific instructions. While your treatment has been done according to the most current medical practices available, unavoidable complications occasionally occur. If you have any problems or questions after discharge, please call your caregiver. HOME CARE INSTRUCTIONS   You may resume normal diet and activities as directed or allowed.  Keep your hand elevated above the level of your heart. This helps decrease pain and swelling.  Keep ice packs (or a bag of ice wrapped in a towel) on the injured area for 15-20 minutes, 03-04 times per day, for the first two days.  Change dressings if necessary or as directed.  Clean the wound daily or as directed.  Only take over-the-counter or prescription medicines for pain, discomfort, or fever as directed by your caregiver.  Keep appointments as directed. SEEK IMMEDIATE MEDICAL CARE IF:  You develop redness, swelling, numbness or increasing pain in the wound.  There is  pus coming from the wound.  You develop an unexplained oral temperature above 102 F (38.9 C) or as your caregiver suggests.  There is a foul (bad) smell coming from the wound or dressing.  There is a breaking open of the wound (edges not staying together) after sutures or staples have been removed. MAKE SURE YOU:   Understand these instructions.  Will watch your condition.  Will get help right away if you are not doing well or get worse. Document Released: 04/25/2005 Document Revised: 08/27/2011 Document Reviewed: 03/24/2008 [facility] Patient Information 2014 [place], [place].

## 2013-07-31 NOTE — H&P (Signed)
David Hester is an 68 y.o. male.   Chief Complaint: right index finger lac HPI: as above s/p work accident wit right index laceration  Past Medical History  Diagnosis Date  . Hypertension   . High cholesterol   . MI (myocardial infarction)     Past Surgical History  Procedure Laterality Date  . Back surgery    . Shoulder surgery      No family history on file. Social History:  reports that he has never smoked. He does not have any smokeless tobacco history on file. He reports that he does not drink alcohol or use illicit drugs.  Allergies:  Allergies  Allergen Reactions  . Ivp Dye [Iodinated Diagnostic Agents] Rash  . Sulfa Antibiotics Rash    Medications Prior to Admission  Medication Sig Dispense Refill  . aspirin 81 MG tablet Take 81 mg by mouth daily.      Marland Kitchen ezetimibe (ZETIA) 10 MG tablet Take 10 mg by mouth every other day.      . lisinopril (PRINIVIL,ZESTRIL) 2.5 MG tablet Take 2.5 mg by mouth daily.      Marland Kitchen lovastatin (MEVACOR) 10 MG tablet Take 10 mg by mouth every other day.      . zolpidem (AMBIEN) 5 MG tablet Take 5 mg by mouth at bedtime as needed for sleep (Pt sts he breaks the tab into "fourths" and does not take every night.).        No results found for this or any previous visit (from the past 48 hour(s)). Dg Finger Index Right  07/31/2013   CLINICAL DATA:  Index finger caught between machinery at work  EXAM: RIGHT INDEX FINGER 2+V  COMPARISON:  None.  FINDINGS: There has been traumatic amputation of the distal aspect of the tuft of the index finger. There is a nondisplaced oblique fracture involving the distal phalanx of the index finger. Punctate well corticated osseous structure adjacent to the radial base of the distal phalanx is favored to be the sequela of either degenerative change or remote avulsive injury. No definitive intra-articular extension. Expected adjacent soft tissue swelling and subcutaneous emphysema. No definite radiopaque foreign body.  Joint spaces appear preserved. No definite erosions.  IMPRESSION: 1. 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 2. Oblique nondisplaced fracture of the distal phalanx of the index finger without definite intra-articular extension. 3. Expected adjacent soft tissue swelling and subcutaneous emphysema. No definite radiopaque foreign body.   Electronically Signed   By: Sandi Mariscal M.D.   On: 07/31/2013 14:19    Review of Systems  All other systems reviewed and are negative.    Blood pressure 137/70, pulse 88, temperature 98.1 F (36.7 C), temperature source Oral, resp. rate 16, height 5\' 10"  (1.778 m), weight 80.74 kg (178 lb), SpO2 98.00%. Physical Exam  Constitutional: He is oriented to person, place, and time. He appears well-developed and well-nourished.  HENT:  Head: Normocephalic and atraumatic.  Cardiovascular: Normal rate.   Respiratory: Effort normal.  Musculoskeletal:       Right hand: He exhibits laceration.  Right index volar lac  Neurological: He is alert and oriented to person, place, and time.  Skin: Skin is warm.  Psychiatric: He has a normal mood and affect. His behavior is normal. Judgment and thought content normal.     Assessment/Plan As above  Plan explore and repair as needed  Deziya Amero A 07/31/2013, 4:54 PM

## 2013-07-31 NOTE — Progress Notes (Signed)
Per Dr. Tobias Alexander cancel all preop work up

## 2013-07-31 NOTE — Op Note (Signed)
See note [id number]

## 2013-07-31 NOTE — Transfer of Care (Signed)
Immediate Anesthesia Transfer of Care Note  Patient: David Hester  Procedure(s) Performed: Procedure(s): Exploration and Repair Complex Laceration Right Index Finger (Right)  Patient Location: PACU  Anesthesia Type:Regional  Level of Consciousness: awake, alert , oriented, patient cooperative and responds to stimulation  Airway & Oxygen Therapy: Patient Spontanous Breathing  Post-op Assessment: Report given to PACU RN, Post -op Vital signs reviewed and stable and Patient moving all extremities X 4  Post vital signs: Reviewed and stable  Complications: No apparent anesthesia complications

## 2013-07-31 NOTE — ED Notes (Signed)
Have called [facility] Short Stay A x3 with  No answer.

## 2013-07-31 NOTE — Anesthesia Preprocedure Evaluation (Addendum)
Anesthesia Evaluation  Patient identified by MRN, date of birth, ID band Patient awake    Reviewed: Allergy & Precautions, H&P , NPO status , Patient's Chart, lab work & pertinent test results  History of Anesthesia Complications Negative for: history of anesthetic complications  Airway Mallampati: II TM Distance: >3 FB Neck ROM: Full    Dental  (+) Edentulous Upper, Implants, Dental Advisory Given   Pulmonary neg pulmonary ROS,    Pulmonary exam normal       Cardiovascular hypertension, Pt. on medications + Past MI     Neuro/Psych negative neurological ROS  negative psych ROS   GI/Hepatic negative GI ROS, Neg liver ROS,   Endo/Other  negative endocrine ROS  Renal/GU negative Renal ROS     Musculoskeletal   Abdominal   Peds  Hematology   Anesthesia Other Findings   Reproductive/Obstetrics                         Anesthesia Physical Anesthesia Plan  ASA: III and emergent  Anesthesia Plan: General and MAC   Post-op Pain Management:    Induction:   Airway Management Planned: Nasal Cannula  Additional Equipment:   Intra-op Plan:   Post-operative Plan: Extubation in OR  Informed Consent: I have reviewed the patients History and Physical, chart, labs and discussed the procedure including the risks, benefits and alternatives for the proposed anesthesia with the patient or authorized representative who has indicated his/her understanding and acceptance.   Dental advisory given  Plan Discussed with: CRNA, Anesthesiologist and Surgeon  Anesthesia Plan Comments:        Anesthesia Quick Evaluation

## 2013-08-01 NOTE — Op Note (Signed)
NAME:  EDUAR, KUMPF NO.:  [id number]  MEDICAL RECORD NO.:  [id number]  LOCATION:  [facility]                         FACILITY:  [facility]  PHYSICIAN:  Sheral Apley. Mica Releford, M.D.DATE OF BIRTH:  05/21/1946  DATE OF PROCEDURE:  07/31/2013 DATE OF DISCHARGE:  07/31/2013                              OPERATIVE REPORT   PREOPERATIVE DIAGNOSIS:  Open distal phalangeal fracture, nail bed laceration, right index finger.  POSTOPERATIVE DIAGNOSIS:  Open distal phalangeal fracture, nail bed laceration, right index finger.  PROCEDURE:  Incision and drainage of above, nail bed repair and open treatment distal phalangeal fracture.  SURGEON:  Sheral Apley. Burney Gauze, M.D.  ASSISTANT:  None.  ANESTHESIA:  Local, 5 mL 1% lidocaine with epinephrine and 0.5 mL of bicarb injected by the surgeon.  TOURNIQUET:  None.  COMPLICATION:  None.  DRAINS:  None.  DESCRIPTION OF THE PROCEDURE:  The patient was given 5 mL of 1% lidocaine with epinephrine and 0.5 mL of bicarb into the middle and proximal phalanges index finger volarly.  He was then taken back to the operating suite, prepped and draped in usual sterile fashion.  A complex laceration of the right index finger tip was incised and drained with clot removal.  We repaired the nail bed and the skin with a 4 and 5-0 Rapide suture including realign the distal phalangeal fracture.  We then dressed the finger with Xeroform 4x4s and a Coban wrap.  The patient tolerated the procedure well, went to recovery room in stable fashion.     Sheral Apley Burney Gauze, M.D.     MAW/MEDQ  D:  07/31/2013  T:  08/01/2013  Job:  [id number]

## 2013-08-03 NOTE — Anesthesia Postprocedure Evaluation (Signed)
Anesthesia Post Note  Patient: David Hester  Procedure(s) Performed: Procedure(s) (LRB): Exploration and Repair Complex Laceration Right Index Finger (Right)  Anesthesia type: MAC  Patient location: PACU  Post pain: Pain level controlled  Post assessment: Patient's Cardiovascular Status Stable  Last Vitals:  Filed Vitals:   07/31/13 1745  BP: 148/83  Pulse: 79  Temp: 37.1 C  Resp: 16    Post vital signs: Reviewed and stable  Level of consciousness: sedated  Complications: No apparent anesthesia complications

## 2013-08-04 ENCOUNTER — Encounter (HOSPITAL_COMMUNITY): Payer: Self-pay | Admitting: Orthopedic Surgery

## 2014-05-31 DIAGNOSIS — M5416 Radiculopathy, lumbar region: Secondary | ICD-10-CM

## 2014-05-31 DIAGNOSIS — M47816 Spondylosis without myelopathy or radiculopathy, lumbar region: Secondary | ICD-10-CM

## 2014-05-31 DIAGNOSIS — M5136 Other intervertebral disc degeneration, lumbar region: Secondary | ICD-10-CM

## 2014-06-18 HISTORY — PX: OTHER SURGICAL HISTORY: SHX169

## 2015-03-09 DIAGNOSIS — I493 Ventricular premature depolarization: Secondary | ICD-10-CM | POA: Insufficient documentation

## 2015-03-09 DIAGNOSIS — I119 Hypertensive heart disease without heart failure: Secondary | ICD-10-CM | POA: Insufficient documentation

## 2015-03-09 DIAGNOSIS — I2510 Atherosclerotic heart disease of native coronary artery without angina pectoris: Secondary | ICD-10-CM | POA: Insufficient documentation

## 2015-03-09 DIAGNOSIS — E785 Hyperlipidemia, unspecified: Secondary | ICD-10-CM

## 2015-03-09 DIAGNOSIS — I129 Hypertensive chronic kidney disease with stage 1 through stage 4 chronic kidney disease, or unspecified chronic kidney disease: Secondary | ICD-10-CM

## 2015-03-09 HISTORY — DX: Atherosclerotic heart disease of native coronary artery without angina pectoris: I25.10

## 2015-03-09 HISTORY — DX: Ventricular premature depolarization: I49.3

## 2015-03-09 HISTORY — DX: Hypertensive heart disease without heart failure: I11.9

## 2015-03-09 HISTORY — DX: Hyperlipidemia, unspecified: E78.5

## 2015-03-30 DIAGNOSIS — K56609 Unspecified intestinal obstruction, unspecified as to partial versus complete obstruction: Secondary | ICD-10-CM | POA: Insufficient documentation

## 2015-03-30 DIAGNOSIS — Z8719 Personal history of other diseases of the digestive system: Secondary | ICD-10-CM | POA: Insufficient documentation

## 2015-03-30 DIAGNOSIS — R739 Hyperglycemia, unspecified: Secondary | ICD-10-CM | POA: Insufficient documentation

## 2015-03-30 HISTORY — DX: Unspecified intestinal obstruction, unspecified as to partial versus complete obstruction: K56.609

## 2015-03-30 HISTORY — DX: Personal history of other diseases of the digestive system: Z87.19

## 2015-03-30 HISTORY — DX: Hyperglycemia, unspecified: R73.9

## 2015-03-31 ENCOUNTER — Ambulatory Visit: Payer: Medicare Other | Admitting: Neurology

## 2015-05-26 DIAGNOSIS — Z8572 Personal history of non-Hodgkin lymphomas: Secondary | ICD-10-CM | POA: Diagnosis not present

## 2015-07-11 DIAGNOSIS — E291 Testicular hypofunction: Secondary | ICD-10-CM | POA: Insufficient documentation

## 2015-07-11 DIAGNOSIS — M25531 Pain in right wrist: Secondary | ICD-10-CM | POA: Insufficient documentation

## 2015-07-11 DIAGNOSIS — Z8572 Personal history of non-Hodgkin lymphomas: Secondary | ICD-10-CM

## 2015-07-11 DIAGNOSIS — G4700 Insomnia, unspecified: Secondary | ICD-10-CM

## 2015-07-11 DIAGNOSIS — R4189 Other symptoms and signs involving cognitive functions and awareness: Secondary | ICD-10-CM | POA: Insufficient documentation

## 2015-07-11 DIAGNOSIS — M25532 Pain in left wrist: Secondary | ICD-10-CM | POA: Insufficient documentation

## 2015-07-11 HISTORY — DX: Testicular hypofunction: E29.1

## 2015-07-11 HISTORY — DX: Other symptoms and signs involving cognitive functions and awareness: R41.89

## 2015-07-11 HISTORY — DX: Pain in right wrist: M25.531

## 2015-07-11 HISTORY — DX: Personal history of non-Hodgkin lymphomas: Z85.72

## 2015-07-11 HISTORY — DX: Pain in left wrist: M25.532

## 2015-07-11 HISTORY — DX: Insomnia, unspecified: G47.00

## 2015-09-23 DIAGNOSIS — Z8572 Personal history of non-Hodgkin lymphomas: Secondary | ICD-10-CM | POA: Diagnosis not present

## 2016-01-26 DIAGNOSIS — C8339 Diffuse large B-cell lymphoma, extranodal and solid organ sites: Secondary | ICD-10-CM | POA: Diagnosis not present

## 2016-03-29 DIAGNOSIS — Z0000 Encounter for general adult medical examination without abnormal findings: Secondary | ICD-10-CM

## 2016-03-29 DIAGNOSIS — F331 Major depressive disorder, recurrent, moderate: Secondary | ICD-10-CM

## 2016-03-29 HISTORY — DX: Major depressive disorder, recurrent, moderate: F33.1

## 2016-03-29 HISTORY — DX: Encounter for general adult medical examination without abnormal findings: Z00.00

## 2016-05-31 DIAGNOSIS — C8339 Diffuse large B-cell lymphoma, extranodal and solid organ sites: Secondary | ICD-10-CM | POA: Diagnosis not present

## 2016-11-29 DIAGNOSIS — Z8572 Personal history of non-Hodgkin lymphomas: Secondary | ICD-10-CM | POA: Diagnosis not present

## 2017-05-30 DIAGNOSIS — Z9221 Personal history of antineoplastic chemotherapy: Secondary | ICD-10-CM | POA: Diagnosis not present

## 2017-05-30 DIAGNOSIS — Z8572 Personal history of non-Hodgkin lymphomas: Secondary | ICD-10-CM

## 2017-06-25 DIAGNOSIS — Z8679 Personal history of other diseases of the circulatory system: Secondary | ICD-10-CM | POA: Insufficient documentation

## 2017-06-25 HISTORY — DX: Personal history of other diseases of the circulatory system: Z86.79

## 2017-11-28 DIAGNOSIS — Z9221 Personal history of antineoplastic chemotherapy: Secondary | ICD-10-CM

## 2017-11-28 DIAGNOSIS — Z8572 Personal history of non-Hodgkin lymphomas: Secondary | ICD-10-CM

## 2018-02-17 NOTE — Progress Notes (Signed)
Cardiology Office Note:    Date:  02/19/2018   ID:  David Hester, David Hester 06-Sep-1945, MRN [id number]  PCP:  Rubie Maid, MD  Cardiologist:  Shirlee More, MD    Referring MD: Rubie Maid, MD    ASSESSMENT:    1. Atherosclerosis of native coronary artery without angina pectoris, unspecified whether native or transplanted heart   2. Essential hypertension   3. Cognitive changes    PLAN:    In order of problems listed above:  1. Stable continue medical treatment he has had some vague nonanginal chest discomfort and will undergo myocardial perfusion study in view of his remote MI and known mild CAD.  For now continue medical treatment aspirin high intensity statin nitroglycerin as needed 2. Stable blood pressure target continue ACE inhibitor 3. He does not feel that his medical therapy has improved his quality of life   Next appointment: 6 months   Medication Adjustments/Labs and Tests Ordered: Current medicines are reviewed at length with the patient today.  Concerns regarding medicines are outlined above.  Orders Placed This Encounter  Procedures  . Myocardial Perfusion Imaging  . EKG 12-Lead   Meds ordered this encounter  Medications  . nitroGLYCERIN (NITROSTAT) 0.4 MG SL tablet    Sig: Place 1 tablet (0.4 mg total) under the tongue every 5 (five) minutes as needed for chest pain.    Dispense:  25 tablet    Refill:  11    Chief Complaint  Patient presents with  . Shortness of Breath  . Coronary Artery Disease    History of Present Illness:    David Hester is a 72 y.o. male with a hx of B cell lymphoma, mild CAD,  hypertension hyperlipidemia and PVC's last seen 03/10/15. Compliance with diet, lifestyle and medications: Yes  Is increasingly troubled by cognitive dysfunction.  He has had some vague nonanginal chest discomfort is concerned about his cardiovascular prognosis is a survivor of lymphoma after discussion will undergo pharmacologic  myocardial perfusion study for risk assessment.  No shortness of breath palpitations syncope or TIA. Past Medical History:  Diagnosis Date  . High cholesterol   . Hypertension   . MI (myocardial infarction) [facility])     Past Surgical History:  Procedure Laterality Date  . BACK SURGERY    . CARDIC CATHERERIZATION    . CERVICAL SPINE SURGERY    . HERNIA REPAIR    . I&D EXTREMITY Right 07/31/2013   Procedure: Exploration and Repair Complex Laceration Right Index Finger;  Surgeon: Schuyler Amor, MD;  Location: [facility];  Service: Orthopedics;  Laterality: Right;  . KIDNEY STONE SURGERY    . KNEE SUGERY     . PR EXPLORATORY OF ABDOMEN   2016  . SHOULDER SURGERY    . SMALL INTESTINE SURGERY      Current Medications: Current Meds  Medication Sig  . aspirin 81 MG tablet Take 81 mg by mouth daily.  . cetirizine (ZYRTEC) 10 MG tablet Take 10 mg by mouth daily as needed.  . Cyanocobalamin (VITAMIN B-12) 5000 MCG SUBL Place 1 tablet under the tongue daily.  Marland Kitchen escitalopram (LEXAPRO) 10 MG tablet Take 10 mg by mouth daily.  Marland Kitchen ibuprofen (ADVIL,MOTRIN) 200 MG tablet Take 200 mg by mouth every 6 (six) hours as needed.  Marland Kitchen lisinopril (PRINIVIL,ZESTRIL) 10 MG tablet Take 10 mg by mouth daily.   Marland Kitchen lovastatin (MEVACOR) 10 MG tablet Take 10 mg by mouth daily.   . MULTIPLE VITAMIN PO Take  1 tablet by mouth daily.   Marland Kitchen testosterone cypionate (DEPO-TESTOSTERONE) 200 MG/ML injection INJECT 1 ML EVERY 3 WEEKS FOR LOW TESTOSTERONE  . traMADol (ULTRAM) 50 MG tablet Take 50 mg by mouth every 8 (eight) hours as needed. for pain  . Wheat Dextrin (BENEFIBER DRINK MIX PO) Take 1 packet by mouth daily.  Marland Kitchen zolpidem (AMBIEN) 10 MG tablet Take 10 mg by mouth at bedtime as needed for sleep.      Allergies:   Ivp dye [iodinated diagnostic agents] and Sulfa antibiotics   Social History   Socioeconomic History  . Marital status: Married    Spouse name: Not on file  . Number of children: Not on file  . Years of  education: Not on file  . Highest education level: Not on file  Occupational History  . Not on file  Social Needs  . Financial resource strain: Not on file  . Food insecurity:    Worry: Not on file    Inability: Not on file  . Transportation needs:    Medical: Not on file    Non-medical: Not on file  Tobacco Use  . Smoking status: Never Smoker  Substance and Sexual Activity  . Alcohol use: No  . Drug use: No  . Sexual activity: Not on file  Lifestyle  . Physical activity:    Days per week: Not on file    Minutes per session: Not on file  . Stress: Not on file  Relationships  . Social connections:    Talks on phone: Not on file    Gets together: Not on file    Attends religious service: Not on file    Active member of club or organization: Not on file    Attends meetings of clubs or organizations: Not on file    Relationship status: Not on file  Other Topics Concern  . Not on file  Social History Narrative  . Not on file     Family History: The patient's family history includes Breast cancer in his mother; Colon cancer in his brother. ROS:   Please see the history of present illness.    All other systems reviewed and are negative.  EKGs/Labs/Other Studies Reviewed:    The following studies were reviewed today:  EKG:  EKG ordered today.  The ekg ordered today demonstrates sinus rhythm normal  Recent Labs: No results found for requested labs within last 8760 hours.  Recent Lipid Panel No results found for: CHOL, TRIG, HDL, CHOLHDL, VLDL, LDLCALC, LDLDIRECT  Physical Exam:    VS:  BP (!) 142/88 (BP Location: Right Arm, Patient Position: Sitting, Cuff Size: Normal)   Pulse 83   Ht 5\' 10"  (1.778 m)   Wt 180 lb 3.2 oz (81.7 kg)   SpO2 97%   BMI 25.86 kg/m     Wt Readings from Last 3 Encounters:  02/18/18 180 lb 3.2 oz (81.7 kg)  07/31/13 178 lb (80.7 kg)     GEN:  Well nourished, well developed in no acute distress HEENT: Normal NECK: No JVD; No carotid  bruits LYMPHATICS: No lymphadenopathy CARDIAC: RRR, no murmurs, rubs, gallops RESPIRATORY:  Clear to auscultation without rales, wheezing or rhonchi  ABDOMEN: Soft, non-tender, non-distended MUSCULOSKELETAL:  No edema; No deformity  SKIN: Warm and dry NEUROLOGIC:  Alert and oriented x 3 PSYCHIATRIC:  Normal affect    Signed, Shirlee More, MD  02/19/2018 1:52 PM    [facility]

## 2018-02-18 ENCOUNTER — Encounter: Payer: Self-pay | Admitting: *Deleted

## 2018-02-18 ENCOUNTER — Ambulatory Visit (INDEPENDENT_AMBULATORY_CARE_PROVIDER_SITE_OTHER): Payer: Medicare Other | Admitting: Cardiology

## 2018-02-18 VITALS — BP 142/88 | HR 83 | Ht 70.0 in | Wt 180.2 lb

## 2018-02-18 DIAGNOSIS — I10 Essential (primary) hypertension: Secondary | ICD-10-CM

## 2018-02-18 DIAGNOSIS — R4189 Other symptoms and signs involving cognitive functions and awareness: Secondary | ICD-10-CM

## 2018-02-18 DIAGNOSIS — I2510 Atherosclerotic heart disease of native coronary artery without angina pectoris: Secondary | ICD-10-CM

## 2018-02-18 MED ORDER — NITROGLYCERIN 0.4 MG SL SUBL: 0.4000 mg | SUBLINGUAL_TABLET | SUBLINGUAL | 11 refills | Status: DC | PRN

## 2018-02-18 NOTE — Patient Instructions (Signed)
Medication Instructions:  Your physician has recommended you make the following change in your medication:   START nitroglycerin as needed for chest pain: When having chest pain, stop what you are doing and sit down. Take 1 nitro, wait 5 minutes. Still having chest pain, take 1 nitro, wait 5 minutes. Still having chest pain, take 1 nitro, dial 911. Total of 3 nitro in 15 minutes.  Labwork: None  Testing/Procedures: You had an EKG today.   Your physician has requested that you have a lexiscan myoview. For further information please visit [email or web address]. Please follow instruction sheet, as given.  Follow-Up: Your physician wants you to follow-up in: 6 months. You will receive a reminder letter in the mail two months in advance. If you don't receive a letter, please call our office to schedule the follow-up appointment.   If you need a refill on your cardiac medications before your next appointment, please call your pharmacy.   Thank you for choosing [facility]! Robyne Peers, RN [phone]   Nitroglycerin sublingual tablets What is this medicine? NITROGLYCERIN (nye troe GLI ser in) is a type of vasodilator. It relaxes blood vessels, increasing the blood and oxygen supply to your heart. This medicine is used to relieve chest pain caused by angina. It is also used to prevent chest pain before activities like climbing stairs, going outdoors in cold weather, or sexual activity. This medicine may be used for other purposes; ask your health care provider or pharmacist if you have questions. COMMON BRAND NAME(S): Nitroquick, Nitrostat, Nitrotab What should I tell my health care provider before I take this medicine? They need to know if you have any of these conditions: -anemia -head injury, recent stroke, or bleeding in the brain -liver disease -previous heart attack -an unusual or allergic reaction to nitroglycerin, other medicines, foods, dyes, or preservatives -pregnant  or trying to get pregnant -breast-feeding How should I use this medicine? Take this medicine by mouth as needed. At the first sign of an angina attack (chest pain or tightness) place one tablet under your tongue. You can also take this medicine 5 to 10 minutes before an event likely to produce chest pain. Follow the directions on the prescription label. Let the tablet dissolve under the tongue. Do not swallow whole. Replace the dose if you accidentally swallow it. It will help if your mouth is not dry. Saliva around the tablet will help it to dissolve more quickly. Do not eat or drink, smoke or chew tobacco while a tablet is dissolving. If you are not better within 5 minutes after taking ONE dose of nitroglycerin, call 9-1-1 immediately to seek emergency medical care. Do not take more than 3 nitroglycerin tablets over 15 minutes. If you take this medicine often to relieve symptoms of angina, your doctor or health care professional may provide you with different instructions to manage your symptoms. If symptoms do not go away after following these instructions, it is important to call 9-1-1 immediately. Do not take more than 3 nitroglycerin tablets over 15 minutes. Talk to your pediatrician regarding the use of this medicine in children. Special care may be needed. Overdosage: If you think you have taken too much of this medicine contact a poison control center or emergency room at once. NOTE: This medicine is only for you. Do not share this medicine with others. What if I miss a dose? This does not apply. This medicine is only used as needed. What may interact with this medicine? Do not take this medicine  with any of the following medications: -certain migraine medicines like ergotamine and dihydroergotamine (DHE) -medicines used to treat erectile dysfunction like sildenafil, tadalafil, and vardenafil -riociguat This medicine may also interact with the following  medications: -alteplase -aspirin -heparin -medicines for high blood pressure -medicines for mental depression -other medicines used to treat angina -phenothiazines like chlorpromazine, mesoridazine, prochlorperazine, thioridazine This list may not describe all possible interactions. Give your health care provider a list of all the medicines, herbs, non-prescription drugs, or dietary supplements you use. Also tell them if you smoke, drink alcohol, or use illegal drugs. Some items may interact with your medicine. What should I watch for while using this medicine? Tell your doctor or health care professional if you feel your medicine is no longer working. Keep this medicine with you at all times. Sit or lie down when you take your medicine to prevent falling if you feel dizzy or faint after using it. Try to remain calm. This will help you to feel better faster. If you feel dizzy, take several deep breaths and lie down with your feet propped up, or bend forward with your head resting between your knees. You may get drowsy or dizzy. Do not drive, use machinery, or do anything that needs mental alertness until you know how this drug affects you. Do not stand or sit up quickly, especially if you are an older patient. This reduces the risk of dizzy or fainting spells. Alcohol can make you more drowsy and dizzy. Avoid alcoholic drinks. Do not treat yourself for coughs, colds, or pain while you are taking this medicine without asking your doctor or health care professional for advice. Some ingredients may increase your blood pressure. What side effects may I notice from receiving this medicine? Side effects that you should report to your doctor or health care professional as soon as possible: -blurred vision -dry mouth -skin rash -sweating -the feeling of extreme pressure in the head -unusually weak or tired Side effects that usually do not require medical attention (report to your doctor or health care  professional if they continue or are bothersome): -flushing of the face or neck -headache -irregular heartbeat, palpitations -nausea, vomiting This list may not describe all possible side effects. Call your doctor for medical advice about side effects. You may report side effects to FDA at 1-800-FDA-1088. Where should I keep my medicine? Keep out of the reach of children. Store at room temperature between 20 and 25 degrees C (68 and 77 degrees F). Store in [facility] [employer]. Protect from light and moisture. Keep tightly closed. Throw away any unused medicine after the expiration date. NOTE: This sheet is a summary. It may not cover all possible information. If you have questions about this medicine, talk to your doctor, pharmacist, or health care provider.  2018 Elsevier/Gold Standard (2013-04-02 17:57:36)    Cardiac Nuclear Scan A cardiac nuclear scan is a test that measures blood flow to the heart when a person is resting and when he or she is exercising. The test looks for problems such as:  Not enough blood reaching a portion of the heart.  The heart muscle not working normally.  You may need this test if:  You have heart disease.  You have had abnormal lab results.  You have had heart surgery or angioplasty.  You have chest pain.  You have shortness of breath.  In this test, a radioactive dye (tracer) is injected into your bloodstream. After the tracer has traveled to your heart, an imaging  device is used to measure how much of the tracer is absorbed by or distributed to various areas of your heart. This procedure is usually done at a hospital and takes 2-4 hours. Tell a health care provider about:  Any allergies you have.  All medicines you are taking, including vitamins, herbs, eye drops, creams, and over-the-counter medicines.  Any problems you or family members have had with the use of anesthetic medicines.  Any blood disorders you have.  Any surgeries you have  had.  Any medical conditions you have.  Whether you are pregnant or may be pregnant. What are the risks? Generally, this is a safe procedure. However, problems may occur, including:  Serious chest pain and heart attack. This is only a risk if the stress portion of the test is done.  Rapid heartbeat.  Sensation of warmth in your chest. This usually passes quickly.  What happens before the procedure?  Ask your health care provider about changing or stopping your regular medicines. This is especially important if you are taking diabetes medicines or blood thinners.  Remove your jewelry on the day of the procedure. What happens during the procedure?  An IV tube will be inserted into one of your veins.  Your health care provider will inject a small amount of radioactive tracer through the tube.  You will wait for 20-40 minutes while the tracer travels through your bloodstream.  Your heart activity will be monitored with an electrocardiogram (ECG).  You will lie down on an exam table.  Images of your heart will be taken for about 15-20 minutes.  You may be asked to exercise on a treadmill or stationary bike. While you exercise, your heart's activity will be monitored with an ECG, and your blood pressure will be checked. If you are unable to exercise, you may be given a medicine to increase blood flow to parts of your heart.  When blood flow to your heart has peaked, a tracer will again be injected through the IV tube.  After 20-40 minutes, you will get back on the exam table and have more images taken of your heart.  When the procedure is over, your IV tube will be removed. The procedure may vary among health care providers and hospitals. Depending on the type of tracer used, scans may need to be repeated 3-4 hours later. What happens after the procedure?  Unless your health care provider tells you otherwise, you may return to your normal schedule, including diet, activities, and  medicines.  Unless your health care provider tells you otherwise, you may increase your fluid intake. This will help flush the contrast dye from your body. Drink enough fluid to keep your urine clear or pale yellow.  It is up to you to get your test results. Ask your health care provider, or the department that is doing the test, when your results will be ready. Summary  A cardiac nuclear scan measures the blood flow to the heart when a person is resting and when he or she is exercising.  You may need this test if you are at risk for heart disease.  Tell your health care provider if you are pregnant.  Unless your health care provider tells you otherwise, increase your fluid intake. This will help flush the contrast dye from your body. Drink enough fluid to keep your urine clear or pale yellow. This information is not intended to replace advice given to you by your health care provider. Make sure you discuss any  questions you have with your health care provider. Document Released: 06/29/2004 Document Revised: 06/06/2016 Document Reviewed: 05/13/2013 Elsevier Interactive Patient Education  2017 [employer].

## 2018-03-05 ENCOUNTER — Telehealth (HOSPITAL_COMMUNITY): Payer: Self-pay | Admitting: *Deleted

## 2018-03-05 NOTE — Telephone Encounter (Signed)
Left message on voicemail per DPR in reference to upcoming appointment scheduled on 03/11/18 with detailed instructions given per Myocardial Perfusion Study Information Sheet for the test. LM to arrive 15 minutes early, and that it is imperative to arrive on time for appointment to keep from having the test rescheduled. If you need to cancel or reschedule your appointment, please call the office within 24 hours of your appointment. Failure to do so may result in a cancellation of your appointment, and a $50 no show fee. Phone number given for call back for any questions. Kirstie Peri

## 2018-03-11 ENCOUNTER — Telehealth: Payer: Self-pay | Admitting: *Deleted

## 2018-03-11 ENCOUNTER — Ambulatory Visit (INDEPENDENT_AMBULATORY_CARE_PROVIDER_SITE_OTHER): Payer: Medicare Other

## 2018-03-11 VITALS — Ht 70.0 in | Wt 180.0 lb

## 2018-03-11 DIAGNOSIS — I2510 Atherosclerotic heart disease of native coronary artery without angina pectoris: Secondary | ICD-10-CM | POA: Diagnosis not present

## 2018-03-11 DIAGNOSIS — I10 Essential (primary) hypertension: Secondary | ICD-10-CM

## 2018-03-11 DIAGNOSIS — R9439 Abnormal result of other cardiovascular function study: Secondary | ICD-10-CM

## 2018-03-11 LAB — MYOCARDIAL PERFUSION IMAGING
LV dias vol: 177 mL (ref 62–150)
LV sys vol: 128 mL
Peak HR: 90 {beats}/min
Rest HR: 77 {beats}/min
SDS: 3
SRS: 8
SSS: 11
TID: 1.05

## 2018-03-11 MED ORDER — REGADENOSON 0.4 MG/5ML IV SOLN
0.4000 mg | Freq: Once | INTRAVENOUS | Status: AC
  Administered 2018-03-11: 0.4 mg via INTRAVENOUS

## 2018-03-11 MED ORDER — TECHNETIUM TC 99M TETROFOSMIN IV KIT
31.5000 | PACK | Freq: Once | INTRAVENOUS | Status: AC | PRN
  Administered 2018-03-11: 31.5 via INTRAVENOUS

## 2018-03-11 MED ORDER — TECHNETIUM TC 99M TETROFOSMIN IV KIT
10.5000 | PACK | Freq: Once | INTRAVENOUS | Status: AC | PRN
  Administered 2018-03-11: 10.5 via INTRAVENOUS

## 2018-03-11 NOTE — Telephone Encounter (Signed)
-----   Message from Richardo Priest, MD sent at 03/11/2018  3:39 PM EDT ----- I am concerned by EF 28%, sometimes seen after intense chemotherapy  Please schedule an echo to confirm

## 2018-03-17 ENCOUNTER — Ambulatory Visit (HOSPITAL_BASED_OUTPATIENT_CLINIC_OR_DEPARTMENT_OTHER)
Admission: RE | Admit: 2018-03-17 | Discharge: 2018-03-17 | Disposition: A | Discharge status: Home / Self Care | Payer: Medicare Other | Source: Ambulatory Visit | Attending: Cardiology | Admitting: Cardiology

## 2018-03-17 DIAGNOSIS — I2510 Atherosclerotic heart disease of native coronary artery without angina pectoris: Secondary | ICD-10-CM | POA: Insufficient documentation

## 2018-03-17 DIAGNOSIS — R9439 Abnormal result of other cardiovascular function study: Secondary | ICD-10-CM

## 2018-03-17 DIAGNOSIS — I119 Hypertensive heart disease without heart failure: Secondary | ICD-10-CM | POA: Diagnosis not present

## 2018-03-17 DIAGNOSIS — E785 Hyperlipidemia, unspecified: Secondary | ICD-10-CM | POA: Diagnosis not present

## 2018-03-17 DIAGNOSIS — I252 Old myocardial infarction: Secondary | ICD-10-CM | POA: Diagnosis not present

## 2018-03-17 DIAGNOSIS — I340 Nonrheumatic mitral (valve) insufficiency: Secondary | ICD-10-CM | POA: Diagnosis not present

## 2018-03-17 DIAGNOSIS — I10 Essential (primary) hypertension: Secondary | ICD-10-CM | POA: Diagnosis not present

## 2018-03-17 NOTE — Progress Notes (Signed)
  Echocardiogram 2D Echocardiogram has been performed.  Renisha Cockrum T Tremane Spurgeon 03/17/2018, 3:47 PM

## 2018-03-19 NOTE — Progress Notes (Signed)
Cardiology Office Note:    Date:  03/20/2018   ID:  David, Hester Nov 14, 1945, MRN [id number]  PCP:  David Maid, MD  Cardiologist:  David More, MD    Referring MD: David Maid, MD    ASSESSMENT:    1. Dilated cardiomyopathy ([place])   2. Essential hypertension   3. Ventricular premature depolarization   4. Mild CAD    PLAN:    In order of problems listed above:  1. 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  Plan is outlined in the history start low-dose beta-blocker transition ACE to [facility] check labs including proBNP in 2 weeks and plan to uptitrate and consider distal diuretic at that time.  His wife and RN relates he has episodes of weakness he has a history of PVCs and Holter monitor will be applied looking for complex ventricular arrhythmia. 2. See above he will continue medical therapy for hypertension blood pressure target 3. Initiate beta-blocker recheck renal function particularly potassium with a history of PVCs and Holter monitor to screen for complex ventricular arrhythmia that alternative treatment plans. 4. Stable CAD no ischemia and his perfusion study I do not think he requires coronary angiography and a next visit I will explore why is not on a statin. 5. Non-Hodgkin's lymphoma stable managed by oncology [facility] recent labs will be [facility]   Next appointment: 3 weeks   Medication Adjustments/Labs and Tests Ordered: Current medicines are reviewed at length with the patient today.  Concerns regarding medicines are outlined above.  Orders Placed This Encounter  Procedures  . Basic metabolic panel  . Pro b natriuretic peptide (BNP)  . LONG TERM MONITOR (3-14 DAYS)   Meds ordered this encounter  Medications  . carvedilol (COREG) 6.25 MG tablet    Sig: Take 0.5 tablets (3.125 mg total) by mouth 2 (two) times daily.    Dispense:  30 tablet    Refill:  1  . DISCONTD: sacubitril-valsartan (ENTRESTO) 49-51 MG    Sig: Take 1 tablet by  mouth 2 (two) times daily.    Dispense:  60 tablet    Refill:  1    Please Honor Card for David Hester: [id number]; [id number]; IEPPI:[id number]; [id number] XN:[id number]  . DISCONTD: sacubitril-valsartan (ENTRESTO) 49-51 MG    Sig: Take 1 tablet by mouth 2 (two) times daily.    Dispense:  60 tablet    Refill:  1    entresto RXBIN: [id number]; [id number]; RKYHC:[id number]; [id number]  . DISCONTD: sacubitril-valsartan (ENTRESTO) 49-51 MG    Sig: Take 1 tablet by mouth 2 (two) times daily.    Dispense:  60 tablet    Refill:  1    [id number] RXGRP:[id number];RXCPN:loyalty;iSSUER:[phone]  . sacubitril-valsartan (ENTRESTO) 49-51 MG    Sig: Take 1 tablet by mouth 2 (two) times daily.    Dispense:  60 tablet    Refill:  1    Please Honor Card patient is presenting for David Hester: [id number]; Juanna Cao: [id number]; YBOFB: [id number]; ISSUER: [id number] ID: [id number]    Chief Complaint  Patient presents with  . Cardiomyopathy    EF 25-30%    History of Present Illness:    David Hester is a 72 y.o. male with a hx of NHL chemotherapy, mild CAD, PVC;s and severe cardiomyopathy EF 28% MUGA and 25-30% echo  last seen 02/18/18. Compliance with diet, lifestyle and medications: Yes  Chart review 2016 had normal left ventricular function by echocardiogram.  His wife was concerned because recently has had exertional shortness of breath and  episodes of weakness which he thought might be related to arrhythmia.  In the past he had frequent PVCs took a beta-blocker no stopped due to weakness.  He tolerates an ACE inhibitor.  He has no evidence of fluid overload neck vein distention edema or S3.  His perfusion study shows no ischemia and I suspect he has chemotherapy related cardiomyopathy remains non-Hodgkin's start low-dose beta-blocker carvedilol 3.125 twice a day transition from lisinopril to Entresto laxatives recent labs done by oncology at [facility] 2 weeks check BMP proBNP see  me in 3 weeks to uptitrate Holter monitor to look for evidence of ventricular tachycardia. Past Medical History:  Diagnosis Date  . High cholesterol   . Hypertension   . MI (myocardial infarction) [facility])     Past Surgical History:  Procedure Laterality Date  . BACK SURGERY    . CARDIC CATHERERIZATION    . CERVICAL SPINE SURGERY    . HERNIA REPAIR    . I&D EXTREMITY Right 07/31/2013   Procedure: Exploration and Repair Complex Laceration Right Index Finger;  Surgeon: David Amor, MD;  Location: [place];  Service: Orthopedics;  Laterality: Right;  . KIDNEY STONE SURGERY    . KNEE SUGERY     . PR EXPLORATORY OF ABDOMEN   2016  . SHOULDER SURGERY    . SMALL INTESTINE SURGERY      Current Medications: Current Meds  Medication Sig  . aspirin 81 MG tablet Take 81 mg by mouth daily.  . cetirizine (ZYRTEC) 10 MG tablet Take 10 mg by mouth daily as needed.  . Cyanocobalamin (VITAMIN B-12) 5000 MCG SUBL Place 1 tablet under the tongue daily.  Marland Kitchen escitalopram (LEXAPRO) 10 MG tablet Take 10 mg by mouth daily.  Marland Kitchen ibuprofen (ADVIL,MOTRIN) 200 MG tablet Take 200 mg by mouth every 6 (six) hours as needed.  . lovastatin (MEVACOR) 10 MG tablet Take 10 mg by mouth daily.   . MULTIPLE VITAMIN PO Take 1 tablet by mouth daily.   . nitroGLYCERIN (NITROSTAT) 0.4 MG SL tablet Place 1 tablet (0.4 mg total) under the tongue every 5 (five) minutes as needed for chest pain.  . Omega-3 Fatty Acids (FISH OIL PO) Take 2 tablets by mouth daily.  . sildenafil (REVATIO) 20 MG tablet Take 20 mg by mouth daily as needed. 1 - 5 tablets as needed  . testosterone cypionate (DEPO-TESTOSTERONE) 200 MG/ML injection INJECT 1 ML EVERY 3 WEEKS FOR LOW TESTOSTERONE  . traMADol (ULTRAM) 50 MG tablet Take 50 mg by mouth every 8 (eight) hours as needed. for pain  . Wheat Dextrin (BENEFIBER DRINK MIX PO) Take 1 packet by mouth daily.  Marland Kitchen zolpidem (AMBIEN) 10 MG tablet Take 10 mg by mouth at bedtime as needed for sleep.   .  [DISCONTINUED] lisinopril (PRINIVIL,ZESTRIL) 10 MG tablet Take 10 mg by mouth daily.      Allergies:   Donepezil; Ivp dye [iodinated diagnostic agents]; and Sulfa antibiotics   Social History   Socioeconomic History  . Marital status: Married    Spouse name: Not on file  . Number of children: Not on file  . Years of education: Not on file  . Highest education level: Not on file  Occupational History  . Not on file  Social Needs  . Financial resource strain: Not on file  . Food insecurity:    Worry: Not on file    Inability: Not on file  . Transportation needs:    Medical: Not on file  Non-medical: Not on file  Tobacco Use  . Smoking status: Never Smoker  Substance and Sexual Activity  . Alcohol use: No  . Drug use: No  . Sexual activity: Not on file  Lifestyle  . Physical activity:    Days per week: Not on file    Minutes per session: Not on file  . Stress: Not on file  Relationships  . Social connections:    Talks on phone: Not on file    Gets together: Not on file    Attends religious service: Not on file    Active member of club or organization: Not on file    Attends meetings of clubs or organizations: Not on file    Relationship status: Not on file  Other Topics Concern  . Not on file  Social History Narrative  . Not on file     Family History: The patient's family history includes Breast cancer in his mother; Colon cancer in his brother. ROS:   Please see the history of present illness.    All other systems reviewed and are negative.  EKGs/Labs/Other Studies Reviewed:    The following studies were reviewed today:    Recent Labs:jan 2019 Cr 1.4 K 4.9 No results found for requested labs within last 8760 hours.  Recent Lipid Panel No results found for: CHOL, TRIG, HDL, CHOLHDL, VLDL, LDLCALC, LDLDIRECT  Physical Exam:    VS:  BP (!) 142/88 (BP Location: Left Arm, Patient Position: Sitting, Cuff Size: Normal)   Pulse 78   Ht 5\' 10"  (1.778 m)    Wt 181 lb 1.9 oz (82.2 kg)   SpO2 96%   BMI 25.99 kg/m     Wt Readings from Last 3 Encounters:  03/20/18 181 lb 1.9 oz (82.2 kg)  03/11/18 180 lb (81.6 kg)  02/18/18 180 lb 3.2 oz (81.7 kg)     GEN:  Well nourished, well developed in no acute distress HEENT: Normal NECK: No JVD; No carotid bruits LYMPHATICS: No lymphadenopathy CARDIAC: RRR, no murmurs, rubs, gallops RESPIRATORY:  Clear to auscultation without rales, wheezing or rhonchi  ABDOMEN: Soft, non-tender, non-distended MUSCULOSKELETAL:  No edema; No deformity  SKIN: Warm and dry NEUROLOGIC:  Alert and oriented x 3 PSYCHIATRIC:  Normal affect    Signed, David More, MD  03/20/2018 9:52 AM    [employer]

## 2018-03-20 ENCOUNTER — Ambulatory Visit (INDEPENDENT_AMBULATORY_CARE_PROVIDER_SITE_OTHER): Payer: Medicare Other | Admitting: Cardiology

## 2018-03-20 VITALS — BP 142/88 | HR 78 | Ht 70.0 in | Wt 181.1 lb

## 2018-03-20 DIAGNOSIS — I420 Dilated cardiomyopathy: Secondary | ICD-10-CM

## 2018-03-20 DIAGNOSIS — I2510 Atherosclerotic heart disease of native coronary artery without angina pectoris: Secondary | ICD-10-CM

## 2018-03-20 DIAGNOSIS — I493 Ventricular premature depolarization: Secondary | ICD-10-CM

## 2018-03-20 DIAGNOSIS — I10 Essential (primary) hypertension: Secondary | ICD-10-CM

## 2018-03-20 HISTORY — DX: Dilated cardiomyopathy: I42.0

## 2018-03-20 MED ORDER — SACUBITRIL-VALSARTAN 49-51 MG PO TABS: 1.0000 | ORAL_TABLET | Freq: Two times a day (BID) | ORAL | 1 refills | Status: DC

## 2018-03-20 MED ORDER — CARVEDILOL 6.25 MG PO TABS: 3.1250 mg | ORAL_TABLET | Freq: Two times a day (BID) | ORAL | 1 refills | Status: DC

## 2018-03-20 NOTE — Patient Instructions (Signed)
Medication Instructions:  Your physician has recommended you make the following change in your medication:   START: Carvedilol 6.25mg  take 1/2 tablet (3.125mg ) twice daily  DISCONTINUE: Lisinopril  AFTER discontinuing Lisinopril x 3 days START Entresto 49-51 mg one tablet twice daily   Labwork: You will have lab work in 2 weeks.  BMP and ProBNP  Testing/Procedures: Your physician has recommended that you wear a holter monitor. Holter monitors are medical devices that record the heart's electrical activity. Doctors most often use these monitors to diagnose arrhythmias. Arrhythmias are problems with the speed or rhythm of the heartbeat. The monitor is a small, portable device. You can wear one while you do your normal daily activities. This is usually used to diagnose what is causing palpitations/syncope (passing out).    Follow-Up: Your physician recommends that you schedule a follow-up appointment in: 3 weeks.    Any Other Special Instructions Will Be Listed Below (If Applicable).     If you need a refill on your cardiac medications before your next appointment, please call your pharmacy.

## 2018-03-26 ENCOUNTER — Ambulatory Visit (INDEPENDENT_AMBULATORY_CARE_PROVIDER_SITE_OTHER): Payer: Medicare Other

## 2018-03-26 DIAGNOSIS — I493 Ventricular premature depolarization: Secondary | ICD-10-CM

## 2018-04-08 LAB — BASIC METABOLIC PANEL
BUN/Creatinine Ratio: 16 (ref 10–24)
BUN: 20 mg/dL (ref 8–27)
CO2: 20 mmol/L (ref 20–29)
Calcium: 9 mg/dL (ref 8.6–10.2)
Chloride: 102 mmol/L (ref 96–106)
Creatinine, Ser: 1.23 mg/dL (ref 0.76–1.27)
GFR calc Af Amer: 66 mL/min/{1.73_m2} (ref >59)
GFR calc non Af Amer: 57 mL/min/{1.73_m2} — ABNORMAL LOW (ref >59)
Glucose: 209 mg/dL — ABNORMAL HIGH (ref 65–99)
Potassium: 4.8 mmol/L (ref 3.5–5.2)
Sodium: 138 mmol/L (ref 134–144)

## 2018-04-08 LAB — PRO B NATRIURETIC PEPTIDE: NT-Pro BNP: 126 pg/mL (ref 0–376)

## 2018-04-11 NOTE — Progress Notes (Signed)
Cardiology Office Note:    Date:  04/14/2018   ID:  David Hester, David Hester 1945/09/20, MRN [id number]  PCP:  Rubie Maid, MD  Cardiologist:  Shirlee More, MD    Referring MD: Rubie Maid, MD    ASSESSMENT:    1. Dilated cardiomyopathy ([place])   2. Essential hypertension   3. Ventricular premature depolarization    PLAN:    In order of problems listed above:  1. Remains relatively asymptomatic tolerates the addition of beta-blocker and Entresto and will cautiously uptitrate today.  Recheck BMP and proBNP in 1 month follow-up in 2 months and attempt to get him to optimal medical therapy.  His event monitor shows frequent PVCs but no ventricular tachycardia will continue beta-blocker.  Likely recheck his echocardiogram in 2 to 3 months 2. Stable home blood pressure in the range of 09/24/8117 systolic. 3. Stable at this time I would not commit him to antiarrhythmic drugs.   Next appointment: 2 months   Medication Adjustments/Labs and Tests Ordered: Current medicines are reviewed at length with the patient today.  Concerns regarding medicines are outlined above.  No orders of the defined types were placed in this encounter.  No orders of the defined types were placed in this encounter.   Chief Complaint  Patient presents with  . Cardiomyopathy    after starting entresto and carvedilol    History of Present Illness:    David Hester is a 72 y.o. male with a hx of NHL chemotherapy, mild CAD, PVC;s and severe cardiomyopathy EF 28% MUGA and 25-30% echo  last seen 03/20/18 and initiated on carvedilol and entresto. Compliance with diet, lifestyle and medications: Yes  He tolerates beta-blocker and Entresto his wife is a Marine scientist monitors his weight blood pressure heart rate and blood sugar at home he had intermittent episodes of weakness is unrelated to hypoglycemia and seems to have improved.  He has some fatigue that preceded his beta-blocker will uptitrate Entresto  next visit uptitrate his beta-blocker and consider adding spironolactone if renal function potassium remain normal.  Recent proBNP level was low.  No orthopnea edema chest pain palpitation or syncope.  On their own they reduced his fish oil to 1/day Past Medical History:  Diagnosis Date  . High cholesterol   . Hypertension   . MI (myocardial infarction) [facility])     Past Surgical History:  Procedure Laterality Date  . BACK SURGERY    . CARDIC CATHERERIZATION    . CERVICAL SPINE SURGERY    . HERNIA REPAIR    . I&D EXTREMITY Right 07/31/2013   Procedure: Exploration and Repair Complex Laceration Right Index Finger;  Surgeon: Schuyler Amor, MD;  Location: [place];  Service: Orthopedics;  Laterality: Right;  . KIDNEY STONE SURGERY    . KNEE SUGERY     . PR EXPLORATORY OF ABDOMEN   2016  . SHOULDER SURGERY    . SMALL INTESTINE SURGERY      Current Medications: Current Meds  Medication Sig  . aspirin 81 MG tablet Take 81 mg by mouth daily.  . carvedilol (COREG) 6.25 MG tablet Take 0.5 tablets (3.125 mg total) by mouth 2 (two) times daily.  . cetirizine (ZYRTEC) 10 MG tablet Take 10 mg by mouth daily as needed.  . Cyanocobalamin (VITAMIN B-12) 5000 MCG SUBL Place 1 tablet under the tongue daily.  Marland Kitchen escitalopram (LEXAPRO) 10 MG tablet Take 10 mg by mouth daily.  Marland Kitchen ibuprofen (ADVIL,MOTRIN) 200 MG tablet Take 200 mg by  mouth every 6 (six) hours as needed.  . lovastatin (MEVACOR) 10 MG tablet Take 10 mg by mouth daily.   . MULTIPLE VITAMIN PO Take 1 tablet by mouth daily.   . nitroGLYCERIN (NITROSTAT) 0.4 MG SL tablet Place 1 tablet (0.4 mg total) under the tongue every 5 (five) minutes as needed for chest pain.  . Omega-3 Fatty Acids (FISH OIL PO) Take 2 tablets by mouth daily.  . sacubitril-valsartan (ENTRESTO) 49-51 MG Take 1 tablet by mouth 2 (two) times daily.  . sildenafil (REVATIO) 20 MG tablet Take 20 mg by mouth daily as needed. 1 - 5 tablets as needed  . testosterone cypionate  (DEPO-TESTOSTERONE) 200 MG/ML injection INJECT 1 ML EVERY 3 WEEKS FOR LOW TESTOSTERONE  . traMADol (ULTRAM) 50 MG tablet Take 50 mg by mouth every 8 (eight) hours as needed. for pain  . Wheat Dextrin (BENEFIBER DRINK MIX PO) Take 1 packet by mouth daily.  Marland Kitchen zolpidem (AMBIEN) 10 MG tablet Take 10 mg by mouth at bedtime as needed for sleep.      Allergies:   Donepezil; Ivp dye [iodinated diagnostic agents]; and Sulfa antibiotics   Social History   Socioeconomic History  . Marital status: Married    Spouse name: Not on file  . Number of children: Not on file  . Years of education: Not on file  . Highest education level: Not on file  Occupational History  . Not on file  Social Needs  . Financial resource strain: Not on file  . Food insecurity:    Worry: Not on file    Inability: Not on file  . Transportation needs:    Medical: Not on file    Non-medical: Not on file  Tobacco Use  . Smoking status: Never Smoker  Substance and Sexual Activity  . Alcohol use: No  . Drug use: No  . Sexual activity: Not on file  Lifestyle  . Physical activity:    Days per week: Not on file    Minutes per session: Not on file  . Stress: Not on file  Relationships  . Social connections:    Talks on phone: Not on file    Gets together: Not on file    Attends religious service: Not on file    Active member of club or organization: Not on file    Attends meetings of clubs or organizations: Not on file    Relationship status: Not on file  Other Topics Concern  . Not on file  Social History Narrative  . Not on file     Family History: The patient's family history includes Breast cancer in his mother; Colon cancer in his brother. ROS:   Please see the history of present illness.    All other systems reviewed and are negative.  EKGs/Labs/Other Studies Reviewed:    The following studies were reviewed today:   Recent Labs: 04/07/2018: BUN 20; Creatinine, Ser 1.23; NT-Pro BNP 126; Potassium  4.8; Sodium 138  Recent Lipid Panel No results found for: CHOL, TRIG, HDL, CHOLHDL, VLDL, LDLCALC, LDLDIRECT  Physical Exam:    VS:  BP 130/76 (BP Location: Right Arm, Patient Position: Sitting, Cuff Size: Normal)   Pulse 82   Ht 5\' 10"  (1.778 m)   Wt 187 lb (84.8 kg)   SpO2 96%   BMI 26.83 kg/m     Wt Readings from Last 3 Encounters:  04/14/18 187 lb (84.8 kg)  03/20/18 181 lb 1.9 oz (82.2 kg)  03/11/18 180  lb (81.6 kg)     GEN:  Well nourished, well developed in no acute distress HEENT: Normal NECK: No JVD; No carotid bruits LYMPHATICS: No lymphadenopathy CARDIAC: RRR, no murmurs, rubs, gallops RESPIRATORY:  Clear to auscultation without rales, wheezing or rhonchi  ABDOMEN: Soft, non-tender, non-distended MUSCULOSKELETAL:  No edema; No deformity  SKIN: Warm and dry NEUROLOGIC:  Alert and oriented x 3 PSYCHIATRIC:  Normal affect    Signed, Shirlee More, MD  04/14/2018 10:25 AM    [place]

## 2018-04-14 ENCOUNTER — Telehealth: Payer: Self-pay

## 2018-04-14 ENCOUNTER — Ambulatory Visit (INDEPENDENT_AMBULATORY_CARE_PROVIDER_SITE_OTHER): Payer: Medicare Other | Admitting: Cardiology

## 2018-04-14 VITALS — BP 130/76 | HR 82 | Ht 70.0 in | Wt 187.0 lb

## 2018-04-14 DIAGNOSIS — E785 Hyperlipidemia, unspecified: Secondary | ICD-10-CM | POA: Diagnosis not present

## 2018-04-14 DIAGNOSIS — I493 Ventricular premature depolarization: Secondary | ICD-10-CM

## 2018-04-14 DIAGNOSIS — I420 Dilated cardiomyopathy: Secondary | ICD-10-CM

## 2018-04-14 DIAGNOSIS — I10 Essential (primary) hypertension: Secondary | ICD-10-CM

## 2018-04-14 MED ORDER — CARVEDILOL 6.25 MG PO TABS: 3.1250 mg | ORAL_TABLET | Freq: Two times a day (BID) | ORAL | 2 refills | Status: DC

## 2018-04-14 MED ORDER — SACUBITRIL-VALSARTAN 97-103 MG PO TABS: 1.0000 | ORAL_TABLET | Freq: Two times a day (BID) | ORAL | 3 refills | Status: DC

## 2018-04-14 MED ORDER — FISH OIL 1000 MG PO CAPS: 1000.0000 mg | ORAL_CAPSULE | Freq: Every day | ORAL | 0 refills | Status: DC

## 2018-04-14 NOTE — Patient Instructions (Signed)
Medication Instructions:  Your physician has recommended you make the following change in your medication:   INCREASE sacubitril-valsartan (entresto) 97-103 mg: Take 1 tablet twice daily   DECREASE fish oil: Take 1 capsule daily   If you need a refill on your cardiac medications before your next appointment, please call your pharmacy.   Lab work: Your physician recommends that you return for lab work in 1 month: lipid panel, BMP, ProBNP. Please return to our [place] office for labs, no appointment needed. Please fast beforehand.   If you have labs (blood work) drawn today and your tests are completely normal, you will receive your results only by: Marland Kitchen MyChart Message (if you have MyChart) OR . A paper copy in the mail If you have any lab test that is abnormal or we need to change your treatment, we will call you to review the results.  Testing/Procedures: You had an EKG today.   Follow-Up: At [facility], you and your health needs are our priority.  As part of our continuing mission to provide you with exceptional heart care, we have created designated Provider Care Teams.  These Care Teams include your primary Cardiologist (physician) and Advanced Practice Providers (APPs -  Physician Assistants and Nurse Practitioners) who all work together to provide you with the care you need, when you need it. You will need a follow up appointment in 2 months.

## 2018-04-14 NOTE — Telephone Encounter (Signed)
Rx sent to pharmacy as requested.

## 2018-05-14 LAB — LIPID PANEL
Chol/HDL Ratio: 5.5 ratio — ABNORMAL HIGH (ref 0.0–5.0)
Cholesterol, Total: 169 mg/dL (ref 100–199)
HDL: 31 mg/dL — ABNORMAL LOW (ref >39)
LDL Calculated: 65 mg/dL (ref 0–99)
Triglycerides: 367 mg/dL — ABNORMAL HIGH (ref 0–149)
VLDL Cholesterol Cal: 73 mg/dL — ABNORMAL HIGH (ref 5–40)

## 2018-05-14 LAB — BASIC METABOLIC PANEL
BUN/Creatinine Ratio: 14 (ref 10–24)
BUN: 18 mg/dL (ref 8–27)
CO2: 24 mmol/L (ref 20–29)
Calcium: 9.2 mg/dL (ref 8.6–10.2)
Chloride: 98 mmol/L (ref 96–106)
Creatinine, Ser: 1.26 mg/dL (ref 0.76–1.27)
GFR calc Af Amer: 65 mL/min/{1.73_m2} (ref >59)
GFR calc non Af Amer: 56 mL/min/{1.73_m2} — ABNORMAL LOW (ref >59)
Glucose: 100 mg/dL — ABNORMAL HIGH (ref 65–99)
Potassium: 4.8 mmol/L (ref 3.5–5.2)
Sodium: 140 mmol/L (ref 134–144)

## 2018-05-14 LAB — PRO B NATRIURETIC PEPTIDE: NT-Pro BNP: 206 pg/mL (ref 0–376)

## 2018-05-19 DIAGNOSIS — L299 Pruritus, unspecified: Secondary | ICD-10-CM

## 2018-05-19 DIAGNOSIS — Z8572 Personal history of non-Hodgkin lymphomas: Secondary | ICD-10-CM | POA: Diagnosis not present

## 2018-05-19 DIAGNOSIS — Z9221 Personal history of antineoplastic chemotherapy: Secondary | ICD-10-CM

## 2018-06-02 NOTE — Progress Notes (Signed)
Cardiology Office Note:    Date:  06/03/2018   ID:  David Hester, David Hester 06/17/1946, MRN [id number]  PCP:  David Maid, MD  Cardiologist:  David More, MD    Referring MD: David Maid, MD    ASSESSMENT:    1. Dilated cardiomyopathy ([place])   2. Hypertensive heart disease without heart failure   3. Ventricular premature depolarization    PLAN:    In order of problems listed above:  1. He has developed weight gain of about 9 pounds a little bit of peripheral edema and mild shortness of breath all consistent with heart failure his EF is severely reduced will start a minimum diuretic furosemide 20 mg 2 days a week in 2 weeks check renal function proBNP for safety.  He is on optimal medical therapy we will recheck echocardiogram I will see back in 3 months.  I told his wife is an Therapist, sports that if he needs to take the diuretic Hester frequently to go ahead and give it to him and we can adjust his prescription 2. Stable blood pressure at target on level hold on increasing his beta-blocker adding a diuretic this visit 3. Stable continue low-dose beta-blocker if EF remains less than 35 want to consider the merits of ICD therapy   Next appointment: 3 months   Medication Adjustments/Labs and Tests Ordered: Current medicines are reviewed at length with the patient today.  Concerns regarding medicines are outlined above.  No orders of the defined types were placed in this encounter.  No orders of the defined types were placed in this encounter.   Chief Complaint  Patient presents with  . Follow-up  . Cardiomyopathy    History of Present Illness:    David Hester is a 72 y.o. male with a hx of  NHL chemotherapy, mild CAD, PVC;s and severe cardiomyopathy EF 28% MUGA and 25-30% echo last seen 04/14/18.   Ref Range & Units 2wk ago 11mo ago  NT-Pro BNP 0 - 376 pg/mL 206  126 CM    Compliance with diet, lifestyle and medications: Yes  His wife and RN is noticed his weight up  9 pounds a little bit of peripheral edema at the end of the day and mild exertional shortness of breath.  He has had no angina palpitations syncope or TIA he has a degree of dementia and is just unaware at this time that he has been breathless.  He presents mildly decompensated heart failure Past Medical History:  Diagnosis Date  . High cholesterol   . Hypertension   . MI (myocardial infarction) [facility])     Past Surgical History:  Procedure Laterality Date  . BACK SURGERY    . CARDIAC CATHETERIZATION    . CARDIC CATHERERIZATION    . CERVICAL SPINE SURGERY    . HERNIA REPAIR    . I&D EXTREMITY Right 07/31/2013   Procedure: Exploration and Repair Complex Laceration Right Index Finger;  Surgeon: Schuyler Amor, MD;  Location: [place];  Service: Orthopedics;  Laterality: Right;  . KIDNEY STONE SURGERY    . KNEE SUGERY     . PR EXPLORATORY OF ABDOMEN   2016  . SHOULDER SURGERY    . SMALL INTESTINE SURGERY      Current Medications: Current Meds  Medication Sig  . aspirin 81 MG tablet Take 81 mg by mouth daily.  . carvedilol (COREG) 6.25 MG tablet Take 0.5 tablets (3.125 mg total) by mouth 2 (two) times daily.  . cetirizine (  ZYRTEC) 10 MG tablet Take 10 mg by mouth daily as needed.  . Cyanocobalamin (VITAMIN B-12) 5000 MCG SUBL Place 1 tablet under the tongue daily.  Marland Kitchen escitalopram (LEXAPRO) 10 MG tablet Take 10 mg by mouth daily.  Marland Kitchen ibuprofen (ADVIL,MOTRIN) 200 MG tablet Take 200 mg by mouth every 6 (six) hours as needed.  . lovastatin (MEVACOR) 10 MG tablet Take 10 mg by mouth daily.   . MULTIPLE VITAMIN PO Take 1 tablet by mouth daily.   . nitroGLYCERIN (NITROSTAT) 0.4 MG SL tablet Place 1 tablet (0.4 mg total) under the tongue every 5 (five) minutes as needed for chest pain.  . Omega-3 Fatty Acids (FISH OIL) 1000 MG CAPS Take 1 capsule (1,000 mg total) by mouth daily.  . sacubitril-valsartan (ENTRESTO) 97-103 MG Take 1 tablet by mouth 2 (two) times daily.  . sildenafil (REVATIO) 20  MG tablet Take 20 mg by mouth daily as needed. 1 - 5 tablets as needed  . testosterone cypionate (DEPO-TESTOSTERONE) 200 MG/ML injection INJECT 1 ML EVERY 3 WEEKS FOR LOW TESTOSTERONE  . traMADol (ULTRAM) 50 MG tablet Take 50 mg by mouth every 8 (eight) hours as needed. for pain  . Wheat Dextrin (BENEFIBER DRINK MIX PO) Take 1 packet by mouth daily.  Marland Kitchen zolpidem (AMBIEN) 10 MG tablet Take 10 mg by mouth at bedtime as needed for sleep.      Allergies:   Donepezil; Ivp dye [iodinated diagnostic agents]; and Sulfa antibiotics   Social History   Socioeconomic History  . Marital status: Married    Spouse name: Not on file  . Number of children: Not on file  . Years of education: Not on file  . Highest education level: Not on file  Occupational History  . Not on file  Social Needs  . Financial resource strain: Not on file  . Food insecurity:    Worry: Not on file    Inability: Not on file  . Transportation needs:    Medical: Not on file    Non-medical: Not on file  Tobacco Use  . Smoking status: Never Smoker  . Smokeless tobacco: Former Systems developer    Types: Chew  Substance and Sexual Activity  . Alcohol use: No  . Drug use: No  . Sexual activity: Not on file  Lifestyle  . Physical activity:    Days per week: Not on file    Minutes per session: Not on file  . Stress: Not on file  Relationships  . Social connections:    Talks on phone: Not on file    Gets together: Not on file    Attends religious service: Not on file    Active member of club or organization: Not on file    Attends meetings of clubs or organizations: Not on file    Relationship status: Not on file  Other Topics Concern  . Not on file  Social History Narrative  . Not on file     Family History: The patient's family history includes Breast cancer in his mother; Colon cancer in his brother. ROS:   Please see the history of present illness.    All other systems reviewed and are negative.  EKGs/Labs/Other  Studies Reviewed:    The following studies were reviewed today: 3 day Zio showed occasional PVCs couplets 41 triplets but no episodes of ventricular tachycardia Recent Labs: 05/14/2018: BUN 18; Creatinine, Ser 1.26; NT-Pro BNP 206; Potassium 4.8; Sodium 140  Recent Lipid Panel    Component Value  Date/Time   CHOL 169 05/14/2018 0905   TRIG 367 (H) 05/14/2018 0905   HDL 31 (L) 05/14/2018 0905   CHOLHDL 5.5 (H) 05/14/2018 0905   LDLCALC 65 05/14/2018 0905    Physical Exam:    VS:  There were no vitals taken for this visit.    Wt Readings from Last 3 Encounters:  04/14/18 187 lb (84.8 kg)  03/20/18 181 lb 1.9 oz (82.2 kg)  03/11/18 180 lb (81.6 kg)     GEN:  Well nourished, well developed in no acute distress HEENT: Normal NECK: No JVD; No carotid bruits LYMPHATICS: No lymphadenopathy CARDIAC: RRR, no murmurs, rubs, gallops RESPIRATORY:  Clear to auscultation without rales, wheezing or rhonchi  ABDOMEN: Soft, non-tender, non-distended MUSCULOSKELETAL:  No edema; No deformity  SKIN: Warm and dry NEUROLOGIC:  Alert and oriented x 3 PSYCHIATRIC:  Normal affect    Signed, David More, MD  06/03/2018 2:46 PM    [facility]

## 2018-06-03 ENCOUNTER — Ambulatory Visit (INDEPENDENT_AMBULATORY_CARE_PROVIDER_SITE_OTHER): Payer: Medicare Other | Admitting: Cardiology

## 2018-06-03 ENCOUNTER — Encounter: Payer: Self-pay | Admitting: Cardiology

## 2018-06-03 VITALS — BP 132/74 | HR 76 | Ht 70.0 in | Wt 190.0 lb

## 2018-06-03 DIAGNOSIS — I493 Ventricular premature depolarization: Secondary | ICD-10-CM | POA: Diagnosis not present

## 2018-06-03 DIAGNOSIS — I420 Dilated cardiomyopathy: Secondary | ICD-10-CM

## 2018-06-03 DIAGNOSIS — R609 Edema, unspecified: Secondary | ICD-10-CM | POA: Diagnosis not present

## 2018-06-03 DIAGNOSIS — I119 Hypertensive heart disease without heart failure: Secondary | ICD-10-CM | POA: Diagnosis not present

## 2018-06-03 DIAGNOSIS — R635 Abnormal weight gain: Secondary | ICD-10-CM

## 2018-06-03 MED ORDER — FUROSEMIDE 20 MG PO TABS: ORAL_TABLET | ORAL | 3 refills | Status: DC

## 2018-06-03 NOTE — Patient Instructions (Addendum)
Medication Instructions:  Your physician has recommended you make the following change in your medication:   START:  Furosemide (lasix) 20mg  take one tablet two times per week.     If you need a refill on your cardiac medications before your next appointment, please call your pharmacy.   Lab work: You will return for lab work in 2 weeks  BMP and ProBNP  If you have labs (blood work) drawn today and your tests are completely normal, you will receive your results only by: Marland Kitchen MyChart Message (if you have MyChart) OR . A paper copy in the mail If you have any lab test that is abnormal or we need to change your treatment, we will call you to review the results.  Testing/Procedures: Your physician has requested that you have an echocardiogram. Echocardiography is a painless test that uses sound waves to create images of your heart. It provides your doctor with information about the size and shape of your heart and how well your heart's chambers and valves are working. This procedure takes approximately one hour. There are no restrictions for this procedure.    Follow-Up: At [facility], you and your health needs are our priority.  As part of our continuing mission to provide you with exceptional heart care, we have created designated Provider Care Teams.  These Care Teams include your primary Cardiologist (physician) and Advanced Practice Providers (APPs -  Physician Assistants and Nurse Practitioners) who all work together to provide you with the care you need, when you need it. You will need a follow up appointment in 3 months

## 2018-06-04 ENCOUNTER — Ambulatory Visit (HOSPITAL_BASED_OUTPATIENT_CLINIC_OR_DEPARTMENT_OTHER)
Admission: RE | Admit: 2018-06-04 | Discharge: 2018-06-04 | Disposition: A | Discharge status: Home / Self Care | Payer: Medicare Other | Source: Ambulatory Visit | Attending: Cardiology | Admitting: Cardiology

## 2018-06-04 DIAGNOSIS — I420 Dilated cardiomyopathy: Secondary | ICD-10-CM

## 2018-06-04 DIAGNOSIS — R635 Abnormal weight gain: Secondary | ICD-10-CM | POA: Diagnosis present

## 2018-06-04 DIAGNOSIS — R609 Edema, unspecified: Secondary | ICD-10-CM | POA: Insufficient documentation

## 2018-06-04 NOTE — Progress Notes (Signed)
  Echocardiogram 2D Echocardiogram has been performed.  Sully Dyment T Enzley Kitchens 06/04/2018, 2:33 PM

## 2018-06-20 LAB — BASIC METABOLIC PANEL
BUN/Creatinine Ratio: 20 (ref 10–24)
BUN: 21 mg/dL (ref 8–27)
CO2: 20 mmol/L (ref 20–29)
Calcium: 8.8 mg/dL (ref 8.6–10.2)
Chloride: 98 mmol/L (ref 96–106)
Creatinine, Ser: 1.05 mg/dL (ref 0.76–1.27)
GFR calc Af Amer: 80 mL/min/{1.73_m2} (ref >59)
GFR calc non Af Amer: 70 mL/min/{1.73_m2} (ref >59)
Glucose: 177 mg/dL — ABNORMAL HIGH (ref 65–99)
Potassium: 4.3 mmol/L (ref 3.5–5.2)
Sodium: 136 mmol/L (ref 134–144)

## 2018-06-20 LAB — PRO B NATRIURETIC PEPTIDE: NT-Pro BNP: 272 pg/mL (ref 0–376)

## 2018-07-06 ENCOUNTER — Other Ambulatory Visit: Payer: Self-pay | Admitting: Cardiology

## 2018-08-05 ENCOUNTER — Other Ambulatory Visit: Payer: Self-pay | Admitting: Cardiology

## 2018-08-20 DIAGNOSIS — R81 Glycosuria: Secondary | ICD-10-CM

## 2018-08-20 HISTORY — DX: Glycosuria: R81

## 2018-08-30 NOTE — Progress Notes (Signed)
Cardiology Office Note:    Date:  09/01/2018   ID:  David Hester, David Hester 1946-02-16, MRN [id number]  PCP:  Rubie Maid, MD  Cardiologist:  Shirlee More, MD    Referring MD: Rubie Maid, MD    ASSESSMENT:    1. Dilated cardiomyopathy (David Hester)   2. Chronic systolic (congestive) heart failure (David Hester)   3. Hypertensive heart disease without heart failure   4. Mild CAD    PLAN:    In order of problems listed above:  1. He is on a good medical regimen including beta-blocker Entresto loop diuretic recheck renal function plan to recheck echocardiogram to follow-up in 3 months. 2. Overall is doing well but it sounds like he has days when he is volume overloaded symptomatic and may need to take his loop diuretic daily. 3. Stable hypertension increase his diuretic continue beta-blocker Entresto 4. Stable CAD New [place] Heart Association class I currently not having anginal discomfort and continue treatment including aspirin beta-blocker statin   Next appointment: 3 months   Medication Adjustments/Labs and Tests Ordered: Current medicines are reviewed at length with the patient today.  Concerns regarding medicines are outlined above.  No orders of the defined types were placed in this encounter.  Meds ordered this encounter  Medications  . furosemide (LASIX) 20 MG tablet    Sig: Take 1 tablet (20 mg total) by mouth daily.    Dispense:  90 tablet    Refill:  0    Chief Complaint  Patient presents with  . Follow-up  . Congestive Heart Failure    History of Present Illness:    David Hester is a 73 y.o. male with a hx of NHL chemotherapy, mild CAD, PVC;s and severe cardiomyopathy EF 28% MUGA and 25-30% echo  last seen 06/03/18 with decompensated heart failure. Compliance with diet, lifestyle and medications: Yes  He has had trouble with weight gains up to 6 pounds in a day he is exertionally short of breath and has had sats of 89% as we discussed he will start to  take his diuretic daily no orthopnea chest pain palpitation or syncope.  With a low main coronavirus outbreak we will check labs today plan to see him in 3 months and recheck an echocardiogram around that time.  Fortunately he tolerates his Entresto.  Echo 06/05/18:   Study Conclusions  - Left ventricle: The cavity size was normal. Systolic function was   moderately to severely reduced. The estimated ejection fraction   was in the range of 30% to 35%. Wall motion was normal; there   were no regional wall motion abnormalities. Doppler parameters   are consistent with abnormal left ventricular relaxation (grade 1   diastolic dysfunction). - Mitral valve: There was mild regurgitation.  Impressions:  - 1. Depressed systolic function, visually estimated at 30-35%.   Impaired relaxation.   2. Mild MR. Past Medical History:  Diagnosis Date  . High cholesterol   . Hypertension   . MI (myocardial infarction) [facility])     Past Surgical History:  Procedure Laterality Date  . BACK SURGERY    . BASAL CELL CARCINOMA EXCISION    . CARDIAC CATHETERIZATION    . CARDIC CATHERERIZATION    . CERVICAL SPINE SURGERY    . HERNIA REPAIR    . I&D EXTREMITY Right 07/31/2013   Procedure: Exploration and Repair Complex Laceration Right Index Finger;  Surgeon: Schuyler Amor, MD;  Location: [place];  Service: Orthopedics;  Laterality: Right;  .  KIDNEY STONE SURGERY    . KNEE SUGERY     . PR EXPLORATORY OF ABDOMEN   2016  . SHOULDER SURGERY    . SMALL INTESTINE SURGERY      Current Medications: Current Meds  Medication Sig  . aspirin 81 MG tablet Take 81 mg by mouth daily.  . carvedilol (COREG) 6.25 MG tablet TAKE 1/2 A TABLET BY MOUTH TWICE A DAY  . cetirizine (ZYRTEC) 10 MG tablet Take 10 mg by mouth daily as needed.  . Cyanocobalamin (VITAMIN B-12) 5000 MCG SUBL Place 1 tablet under the tongue daily.  Marland Kitchen ENTRESTO 97-103 MG TAKE 1 TABLET BY MOUTH TWICE A DAY  . escitalopram (LEXAPRO) 10 MG  tablet Take 10 mg by mouth daily.  . furosemide (LASIX) 20 MG tablet Take 1 tablet (20 mg total) by mouth daily.  Marland Kitchen ibuprofen (ADVIL,MOTRIN) 200 MG tablet Take 200 mg by mouth every 6 (six) hours as needed.  . lovastatin (MEVACOR) 10 MG tablet Take 10 mg by mouth daily.   . MULTIPLE VITAMIN PO Take 1 tablet by mouth daily.   . nitroGLYCERIN (NITROSTAT) 0.4 MG SL tablet Place 1 tablet (0.4 mg total) under the tongue every 5 (five) minutes as needed for chest pain.  . Omega-3 Fatty Acids (FISH OIL) 1000 MG CAPS Take 1 capsule (1,000 mg total) by mouth daily.  . sildenafil (REVATIO) 20 MG tablet Take 20 mg by mouth daily as needed. 1 - 5 tablets as needed  . testosterone cypionate (DEPO-TESTOSTERONE) 200 MG/ML injection INJECT 1 ML EVERY 3 WEEKS FOR LOW TESTOSTERONE  . traMADol (ULTRAM) 50 MG tablet Take 50 mg by mouth every 8 (eight) hours as needed. for pain  . Wheat Dextrin (BENEFIBER DRINK MIX PO) Take 1 packet by mouth 2 (two) times daily.   Marland Kitchen zolpidem (AMBIEN) 10 MG tablet Take 10 mg by mouth at bedtime as needed for sleep.   . [DISCONTINUED] furosemide (LASIX) 20 MG tablet Take 1 tablet two times per week (Patient taking differently: Take 1 tablet three times per week)     Allergies:   Donepezil; Ivp dye [iodinated diagnostic agents]; and Sulfa antibiotics   Social History   Socioeconomic History  . Marital status: Married    Spouse name: Not on file  . Number of children: Not on file  . Years of education: Not on file  . Highest education level: Not on file  Occupational History  . Not on file  Social Needs  . Financial resource strain: Not on file  . Food insecurity:    Worry: Not on file    Inability: Not on file  . Transportation needs:    Medical: Not on file    Non-medical: Not on file  Tobacco Use  . Smoking status: Never Smoker  . Smokeless tobacco: Former Systems developer    Types: Chew  Substance and Sexual Activity  . Alcohol use: No  . Drug use: No  . Sexual activity:  Not on file  Lifestyle  . Physical activity:    Days per week: Not on file    Minutes per session: Not on file  . Stress: Not on file  Relationships  . Social connections:    Talks on phone: Not on file    Gets together: Not on file    Attends religious service: Not on file    Active member of club or organization: Not on file    Attends meetings of clubs or organizations: Not on file  Relationship status: Not on file  Other Topics Concern  . Not on file  Social History Narrative  . Not on file     Family History: The patient's family history includes Breast cancer in his mother; Colon cancer in his brother. ROS:   Please see the history of present illness.    All other systems reviewed and are negative.  EKGs/Labs/Other Studies Reviewed:    The following studies were reviewed today:    Recent Labs: 06/20/2018: BUN 21; Creatinine, Ser 1.05; NT-Pro BNP 272; Potassium 4.3; Sodium 136  Recent Lipid Panel    Component Value Date/Time   CHOL 169 05/14/2018 0905   TRIG 367 (H) 05/14/2018 0905   HDL 31 (L) 05/14/2018 0905   CHOLHDL 5.5 (H) 05/14/2018 0905   LDLCALC 65 05/14/2018 0905    Physical Exam:    VS:  BP (!) 148/88 (BP Location: Right Arm, Patient Position: Sitting, Cuff Size: Normal)   Pulse 74   Ht 5\' 10"  (1.778 m)   Wt 187 lb 3.2 oz (84.9 kg)   SpO2 97%   BMI 26.86 kg/m     Wt Readings from Last 3 Encounters:  09/01/18 187 lb 3.2 oz (84.9 kg)  06/03/18 190 lb (86.2 kg)  04/14/18 187 lb (84.8 kg)     GEN:  Well nourished, well developed in no acute distress HEENT: Normal NECK: No JVD; No carotid bruits LYMPHATICS: No lymphadenopathy CARDIAC: RRR, no murmurs, rubs, gallops RESPIRATORY:  Clear to auscultation without rales, wheezing or rhonchi  ABDOMEN: Soft, non-tender, non-distended MUSCULOSKELETAL:  No edema; No deformity  SKIN: Warm and dry NEUROLOGIC:  Alert and oriented x 3 PSYCHIATRIC:  Normal affect    Signed, Shirlee More, MD   09/01/2018 12:06 PM    [place]

## 2018-09-01 ENCOUNTER — Other Ambulatory Visit: Payer: Self-pay

## 2018-09-01 ENCOUNTER — Encounter: Payer: Self-pay | Admitting: Cardiology

## 2018-09-01 ENCOUNTER — Ambulatory Visit (INDEPENDENT_AMBULATORY_CARE_PROVIDER_SITE_OTHER): Payer: Medicare Other | Admitting: Cardiology

## 2018-09-01 VITALS — BP 148/88 | HR 74 | Ht 70.0 in | Wt 187.2 lb

## 2018-09-01 DIAGNOSIS — I420 Dilated cardiomyopathy: Secondary | ICD-10-CM

## 2018-09-01 DIAGNOSIS — I119 Hypertensive heart disease without heart failure: Secondary | ICD-10-CM | POA: Diagnosis not present

## 2018-09-01 DIAGNOSIS — I5022 Chronic systolic (congestive) heart failure: Secondary | ICD-10-CM

## 2018-09-01 DIAGNOSIS — I2510 Atherosclerotic heart disease of native coronary artery without angina pectoris: Secondary | ICD-10-CM | POA: Diagnosis not present

## 2018-09-01 DIAGNOSIS — R0602 Shortness of breath: Secondary | ICD-10-CM

## 2018-09-01 MED ORDER — FUROSEMIDE 20 MG PO TABS: 20.0000 mg | ORAL_TABLET | Freq: Every day | ORAL | 0 refills | Status: DC

## 2018-09-01 NOTE — Patient Instructions (Signed)
Medication Instructions:  Your physician has recommended you make the following change in your medication:   INCREASE furosemide (lasix) 20 mg: Take 1 tablet daily   If you need a refill on your cardiac medications before your next appointment, please call your pharmacy.   Lab work: Your physician recommends that you return for lab work today: BMP, ProBNP.  If you have labs (blood work) drawn today and your tests are completely normal, you will receive your results only by: Marland Kitchen MyChart Message (if you have MyChart) OR . A paper copy in the mail If you have any lab test that is abnormal or we need to change your treatment, we will call you to review the results.  Testing/Procedures: None  Follow-Up: At [facility], you and your health needs are our priority.  As part of our continuing mission to provide you with exceptional heart care, we have created designated Provider Care Teams.  These Care Teams include your primary Cardiologist (physician) and Advanced Practice Providers (APPs -  Physician Assistants and Nurse Practitioners) who all work together to provide you with the care you need, when you need it. You will need a follow up appointment in 3 months.

## 2018-09-02 LAB — BASIC METABOLIC PANEL
BUN/Creatinine Ratio: 11 (ref 10–24)
BUN: 13 mg/dL (ref 8–27)
CO2: 22 mmol/L (ref 20–29)
Calcium: 9.6 mg/dL (ref 8.6–10.2)
Chloride: 98 mmol/L (ref 96–106)
Creatinine, Ser: 1.14 mg/dL (ref 0.76–1.27)
GFR calc Af Amer: 72 mL/min/{1.73_m2} (ref >59)
GFR calc non Af Amer: 63 mL/min/{1.73_m2} (ref >59)
Glucose: 79 mg/dL (ref 65–99)
Potassium: 5.1 mmol/L (ref 3.5–5.2)
Sodium: 137 mmol/L (ref 134–144)

## 2018-09-02 LAB — PRO B NATRIURETIC PEPTIDE: NT-Pro BNP: 672 pg/mL — ABNORMAL HIGH (ref 0–486)

## 2018-10-29 ENCOUNTER — Other Ambulatory Visit: Payer: Self-pay | Admitting: Cardiology

## 2018-10-29 NOTE — Telephone Encounter (Signed)
Rx refill sent to pharmacy. 

## 2018-11-18 DIAGNOSIS — Z8572 Personal history of non-Hodgkin lymphomas: Secondary | ICD-10-CM

## 2018-11-25 ENCOUNTER — Other Ambulatory Visit: Payer: Self-pay | Admitting: Cardiology

## 2018-11-26 ENCOUNTER — Other Ambulatory Visit: Payer: Self-pay

## 2018-11-26 MED ORDER — CARVEDILOL 6.25 MG PO TABS: ORAL_TABLET | ORAL | 1 refills | Status: DC

## 2018-11-26 NOTE — Telephone Encounter (Signed)
Refill for carvedilol sent to [facility]

## 2018-12-01 ENCOUNTER — Telehealth: Payer: Self-pay | Admitting: Cardiology

## 2018-12-01 NOTE — Telephone Encounter (Signed)
Virtual Visit Pre-Appointment Phone Call  "(Name), I am calling you today to discuss your upcoming appointment. We are currently trying to limit exposure to the virus that causes COVID-19 by seeing patients at home rather than in the office."  1. "What is the BEST phone number to call the day of the visit?" - include this in appointment notes  2. Do you have or have access to (through a family member/friend) a smartphone with video capability that we can use for your visit?" a. If yes - list this number in appt notes as cell (if different from BEST phone #) and list the appointment type as a VIDEO visit in appointment notes b. If no - list the appointment type as a PHONE visit in appointment notes  3. Confirm consent - "In the setting of the current Covid19 crisis, you are scheduled for a (phone or video) visit with your provider on (date) at (time).  Just as we do with many in-office visits, in order for you to participate in this visit, we must obtain consent.  If you'd like, I can send this to your mychart (if signed up) or email for you to review.  Otherwise, I can obtain your verbal consent now.  All virtual visits are billed to your insurance company just like a normal visit would be.  By agreeing to a virtual visit, we'd like you to understand that the technology does not allow for your provider to perform an examination, and thus may limit your provider's ability to fully assess your condition. If your provider identifies any concerns that need to be evaluated in person, we will make arrangements to do so.  Finally, though the technology is pretty good, we cannot assure that it will always work on either your or our end, and in the setting of a video visit, we may have to convert it to a phone-only visit.  In either situation, we cannot ensure that we have a secure connection.  Are you willing to proceed?" STAFF: Did the patient verbally acknowledge consent to telehealth visit? Document  YES/NO here: Yes per spouse  4. Advise patient to be prepared - "Two hours prior to your appointment, go ahead and check your blood pressure, pulse, oxygen saturation, and your weight (if you have the equipment to check those) and write them all down. When your visit starts, your provider will ask you for this information. If you have an Apple Watch or Kardia device, please plan to have heart rate information ready on the day of your appointment. Please have a pen and paper handy nearby the day of the visit as well."  5. Give patient instructions for MyChart download to smartphone OR Doximity/Doxy.me as below if video visit (depending on what platform provider is using)  6. Inform patient they will receive a phone call 15 minutes prior to their appointment time (may be from unknown caller ID) so they should be prepared to answer    David Hester has been deemed a candidate for a follow-up tele-health visit to limit community exposure during the Covid-19 pandemic. I spoke with the patient via phone to ensure availability of phone/video source, confirm preferred email & phone number, and discuss instructions and expectations.  I reminded David Hester to be prepared with any vital sign and/or heart rhythm information that could potentially be obtained via home monitoring, at the time of his visit. I reminded David Hester to expect a phone call  prior to his visit.  David Hester 12/01/2018 2:07 PM   INSTRUCTIONS FOR DOWNLOADING THE MYCHART APP TO SMARTPHONE  - The patient must first make sure to have activated MyChart and know their login information - If Apple, go to [employer] and type in MyChart in the search bar and download the app. If Android, ask patient to go to [employer] and type in [place] in the search bar and download the app. The app is free but as with any other app downloads, their phone may require them to verify saved payment information or  Apple/Android password.  - The patient will need to then log into the app with their MyChart username and password, and select [facility] as their healthcare provider to link the account. When it is time for your visit, go to the MyChart app, find appointments, and click Begin Video Visit. Be sure to Select Allow for your device to access the Microphone and Camera for your visit. You will then be connected, and your provider will be with you shortly.  **If they have any issues connecting, or need assistance please contact MyChart service desk [phone])**  **If using a computer, in order to ensure the best quality for their visit they will need to use either of the following Internet Browsers: [employer], or Google Chrome**  IF USING DOXIMITY or DOXY.ME - The patient will receive a link just prior to their visit by text.     FULL LENGTH CONSENT FOR TELE-HEALTH VISIT   I hereby voluntarily request, consent and authorize [facility] and its employed or contracted physicians, physician assistants, nurse practitioners or other licensed health care professionals (the Practitioner), to provide me with telemedicine health care services (the Services") as deemed necessary by the treating Practitioner. I acknowledge and consent to receive the Services by the Practitioner via telemedicine. I understand that the telemedicine visit will involve communicating with the Practitioner through live audiovisual communication technology and the disclosure of certain medical information by electronic transmission. I acknowledge that I have been given the opportunity to request an in-person assessment or other available alternative prior to the telemedicine visit and am voluntarily participating in the telemedicine visit.  I understand that I have the right to withhold or withdraw my consent to the use of telemedicine in the course of my care at any time, without affecting my right to future care  or treatment, and that the Practitioner or I may terminate the telemedicine visit at any time. I understand that I have the right to inspect all information obtained and/or recorded in the course of the telemedicine visit and may receive copies of available information for a reasonable fee.  I understand that some of the potential risks of receiving the Services via telemedicine include:   Delay or interruption in medical evaluation due to technological equipment failure or disruption;  Information transmitted may not be sufficient (e.g. poor resolution of images) to allow for appropriate medical decision making by the Practitioner; and/or   In rare instances, security protocols could fail, causing a breach of personal health information.  Furthermore, I acknowledge that it is my responsibility to provide information about my medical history, conditions and care that is complete and accurate to the best of my ability. I acknowledge that Practitioner's advice, recommendations, and/or decision may be based on factors not within their control, such as incomplete or inaccurate data provided by me or distortions of diagnostic images or specimens that may result from electronic  transmissions. I understand that the practice of medicine is not an exact science and that Practitioner makes no warranties or guarantees regarding treatment outcomes. I acknowledge that I will receive a copy of this consent concurrently upon execution via email to the email address I last provided but may also request a printed copy by calling the office of [facility].    I understand that my insurance will be billed for this visit.   I have read or had this consent read to me.  I understand the contents of this consent, which adequately explains the benefits and risks of the Services being provided via telemedicine.   I have been provided ample opportunity to ask questions regarding this consent and the Services and have had  my questions answered to my satisfaction.  I give my informed consent for the services to be provided through the use of telemedicine in my medical care  By participating in this telemedicine visit I agree to the above.

## 2018-12-01 NOTE — Progress Notes (Signed)
Virtual Visit via Video Note   This visit type was conducted due to national recommendations for restrictions regarding the COVID-19 Pandemic (e.g. social distancing) in an effort to limit this patient's exposure and mitigate transmission in our community.  Due to his co-morbid illnesses, this patient is at least at moderate risk for complications without adequate follow up.  This format is felt to be most appropriate for this patient at this time.  All issues noted in this document were discussed and addressed.  A limited physical exam was performed with this format.  Please refer to the patient's chart for his consent to telehealth for [facility].   Date:  12/01/2018   ID:  David, Hester 04-06-1946, MRN [id number]  Patient Location: Home Provider Location: Office  PCP:  Rubie Maid, MD  Cardiologist:  No primary care provider on file.  Electrophysiologist:  None   Evaluation Performed:  Follow-Up Visit  Chief Complaint:  Heart failure  History of Present Illness:    David Hester is a 73 y.o. male with NHL chemotherapy, mild CAD, PVC;s and severe cardiomyopathy EF 28% MUGA and 25-30% by echo  last seen by me 09/01/2018.  From the perspective of his lymphoma he is doing well at his next follow-up if he continues to be well he will be perceived as cured has had trouble with his weight increasing and unfortunately is drinking Gatorade and eating crackers with salt water and take an extra tablet of furosemide any day he weighs 182 pounds or greater.  He remains fatigued on the minimum dose of beta-blocker and the heart rate in the 60s and he has exercise intolerance when he does garden work bending over upper extremities he is diaphoretic short of breath and at times is oxygen sats as low as 89%.  He has no edema orthopnea chest pain or syncope.  We will plan on doing an echocardiogram and if EF remains severely reduced refer him for consideration of ICD therapy and I  will also initiate MRA at that time.  Recent labs from 11/18/2018 from oncology shows a GFR 54 cc creatinine 1.3 potassium 4.4 hemoglobin 15.9 The patient does not have symptoms concerning for COVID-19 infection (fever, chills, cough, or new shortness of breath).    Past Medical History:  Diagnosis Date  . High cholesterol   . Hypertension   . MI (myocardial infarction) [facility])    Past Surgical History:  Procedure Laterality Date  . BACK SURGERY    . BASAL CELL CARCINOMA EXCISION    . CARDIAC CATHETERIZATION    . CARDIC CATHERERIZATION    . CERVICAL SPINE SURGERY    . HERNIA REPAIR    . I&D EXTREMITY Right 07/31/2013   Procedure: Exploration and Repair Complex Laceration Right Index Finger;  Surgeon: Schuyler Amor, MD;  Location: [place];  Service: Orthopedics;  Laterality: Right;  . KIDNEY STONE SURGERY    . KNEE SUGERY     . PR EXPLORATORY OF ABDOMEN   2016  . SHOULDER SURGERY    . SMALL INTESTINE SURGERY       No outpatient medications have been marked as taking for the 12/02/18 encounter (Appointment) with Richardo Priest, MD.     Allergies:   Donepezil, Ivp dye [iodinated diagnostic agents], and Sulfa antibiotics   Social History   Tobacco Use  . Smoking status: Never Smoker  . Smokeless tobacco: Former Systems developer    Types: Chew  Substance Use Topics  . Alcohol  use: No  . Drug use: No     Family Hx: The patient's family history includes Breast cancer in his mother; Colon cancer in his brother.  ROS:   Please see the history of present illness.     All other systems reviewed and are negative.   Prior CV studies:   The following studies were reviewed today:    Labs/Other Tests and Data Reviewed:    EKG:  No ECG reviewed.  Recent Labs: 09/01/2018: BUN 13; Creatinine, Ser 1.14; NT-Pro BNP 672; Potassium 5.1; Sodium 137   Recent Lipid Panel Lab Results  Component Value Date/Time   CHOL 169 05/14/2018 09:05 AM   TRIG 367 (H) 05/14/2018 09:05 AM   HDL 31 (L)  05/14/2018 09:05 AM   CHOLHDL 5.5 (H) 05/14/2018 09:05 AM   LDLCALC 65 05/14/2018 09:05 AM    Wt Readings from Last 3 Encounters:  09/01/18 187 lb 3.2 oz (84.9 kg)  06/03/18 190 lb (86.2 kg)  04/14/18 187 lb (84.8 kg)     Objective:    Vital Signs:  There were no vitals taken for this visit.   VITAL SIGNS:  reviewed GEN:  no acute distress EYES:  sclerae anicteric, EOMI - Extraocular Movements Intact RESPIRATORY:  normal respiratory effort, symmetric expansion CARDIOVASCULAR:  no peripheral edema SKIN:  no rash, lesions or ulcers. MUSCULOSKELETAL:  no obvious deformities. NEURO:  alert and oriented x 3, no obvious focal deficit PSYCH:  normal affect  ASSESSMENT & PLAN:     1.  Dilated cardiomyopathy severe he is on guideline directed therapy maximally tolerated dose of beta-blocker high-dose Entresto his loop diuretic recheck EF if remains less than 40 will then later on spironolactone and refer to EP for consideration of ICD Hypertensive heart disease with heart failure stable see discussion above, I am concerned about weight gain and shortness of breath his wife will do her best to restrict his sodium intake and will have him take extra diuretic if his weight is 182 pounds or greater.  Continue his maximally tolerated beta-blocker Entresto loop diuretic and if EF remains reduced cautiously institute spironolactone with low ejection fraction and reduced GFR CAD mild stable if referred for an ICD may need to consider the merits of coronary angiogram prior to procedure Frequent PVCs stable continue his beta-blocker Non-Hodgkin's lymphoma stable managed by oncology Dr. Bobby Rumpf vascular  COVID-19 Education: The signs and symptoms of COVID-19 were discussed with the patient and how to seek care for testing (follow up with PCP or arrange E-visit).  The importance of social distancing was discussed today.  Time:   Today, I have spent 20 minutes with the patient with telehealth  technology discussing the above problems.     Medication Adjustments/Labs and Tests Ordered: Current medicines are reviewed at length with the patient today.  Concerns regarding medicines are outlined above.   Tests Ordered: No orders of the defined types were placed in this encounter.   Medication Changes: No orders of the defined types were placed in this encounter.   Follow Up:  In Person in 4 week(s)  Signed, Shirlee More, MD  12/01/2018 11:45 AM    [place]

## 2018-12-02 ENCOUNTER — Telehealth (INDEPENDENT_AMBULATORY_CARE_PROVIDER_SITE_OTHER): Payer: Medicare Other | Admitting: Cardiology

## 2018-12-02 ENCOUNTER — Encounter: Payer: Self-pay | Admitting: Cardiology

## 2018-12-02 ENCOUNTER — Other Ambulatory Visit: Payer: Self-pay

## 2018-12-02 VITALS — BP 128/70 | HR 68 | Temp 96.6°F | Wt 183.0 lb

## 2018-12-02 DIAGNOSIS — I5022 Chronic systolic (congestive) heart failure: Secondary | ICD-10-CM

## 2018-12-02 DIAGNOSIS — I420 Dilated cardiomyopathy: Secondary | ICD-10-CM

## 2018-12-02 DIAGNOSIS — Z8572 Personal history of non-Hodgkin lymphomas: Secondary | ICD-10-CM

## 2018-12-02 DIAGNOSIS — I110 Hypertensive heart disease with heart failure: Secondary | ICD-10-CM

## 2018-12-02 DIAGNOSIS — I2510 Atherosclerotic heart disease of native coronary artery without angina pectoris: Secondary | ICD-10-CM

## 2018-12-02 DIAGNOSIS — I493 Ventricular premature depolarization: Secondary | ICD-10-CM

## 2018-12-02 MED ORDER — FUROSEMIDE 20 MG PO TABS: 20.0000 mg | ORAL_TABLET | Freq: Every day | ORAL | 0 refills | Status: DC

## 2018-12-02 MED ORDER — CARVEDILOL 6.25 MG PO TABS: ORAL_TABLET | ORAL | 1 refills | Status: DC

## 2018-12-02 NOTE — Patient Instructions (Signed)
Medication Instructions:  Your physician has recommended you make the following change in your medication:   TAKE Additional 20 mg of furosemide (1 Tab) if weight greater than 182 lb  If you need a refill on your cardiac medications before your next appointment, please call your pharmacy.   Lab work: None If you have labs (blood work) drawn today and your tests are completely normal, you will receive your results only by: Marland Kitchen MyChart Message (if you have MyChart) OR . A paper copy in the mail If you have any lab test that is abnormal or we need to change your treatment, we will call you to review the results.  Testing/Procedures: Your physician has requested that you have an echocardiogram. Echocardiography is a painless test that uses sound waves to create images of your heart. It provides your doctor with information about the size and shape of your heart and how well your heart's chambers and valves are working. This procedure takes approximately one hour. There are no restrictions for this procedure.  YOUR APPOINTMENT FOR YOUR ECHO IS : July 20,2020 at 1:15PM  Follow-Up: At [facility], you and your health needs are our priority.  As part of our continuing mission to provide you with exceptional heart care, we have created designated Provider Care Teams.  These Care Teams include your primary Cardiologist (physician) and Advanced Practice Providers (APPs -  Physician Assistants and Nurse Practitioners) who all work together to provide you with the care you need, when you need it. You will need a follow up appointment in 1 months.  Any Other Special Instructions Will Be Listed Below (If Applicable).   Echocardiogram An echocardiogram is a procedure that uses painless sound waves (ultrasound) to produce an image of the heart. Images from an echocardiogram can provide important information about:  Signs of coronary artery disease (CAD).  Aneurysm detection. An aneurysm is a weak or  damaged part of an artery wall that bulges out from the normal force of blood pumping through the body.  Heart size and shape. Changes in the size or shape of the heart can be associated with certain conditions, including heart failure, aneurysm, and CAD.  Heart muscle function.  Heart valve function.  Signs of a past heart attack.  Fluid buildup around the heart.  Thickening of the heart muscle.  A tumor or infectious growth around the heart valves. Tell a health care provider about:  Any allergies you have.  All medicines you are taking, including vitamins, herbs, eye drops, creams, and over-the-counter medicines.  Any blood disorders you have.  Any surgeries you have had.  Any medical conditions you have.  Whether you are pregnant or may be pregnant. What are the risks? Generally, this is a safe procedure. However, problems may occur, including:  Allergic reaction to dye (contrast) that may be used during the procedure. What happens before the procedure? No specific preparation is needed. You may eat and drink normally. What happens during the procedure?   An IV tube may be inserted into one of your veins.  You may receive contrast through this tube. A contrast is an injection that improves the quality of the pictures from your heart.  A gel will be applied to your chest.  A wand-like tool (transducer) will be moved over your chest. The gel will help to transmit the sound waves from the transducer.  The sound waves will harmlessly bounce off of your heart to allow the heart images to be captured in  real-time motion. The images will be recorded on a computer. The procedure may vary among health care providers and hospitals. What happens after the procedure?  You may return to your normal, everyday life, including diet, activities, and medicines, unless your health care provider tells you not to do that. Summary  An echocardiogram is a procedure that uses painless  sound waves (ultrasound) to produce an image of the heart.  Images from an echocardiogram can provide important information about the size and shape of your heart, heart muscle function, heart valve function, and fluid buildup around your heart.  You do not need to do anything to prepare before this procedure. You may eat and drink normally.  After the echocardiogram is completed, you may return to your normal, everyday life, unless your health care provider tells you not to do that. This information is not intended to replace advice given to you by your health care provider. Make sure you discuss any questions you have with your health care provider. Document Released: 06/01/2000 Document Revised: 07/07/2016 Document Reviewed: 07/07/2016 Elsevier Interactive Patient Education  2019 [employer].

## 2019-01-05 ENCOUNTER — Other Ambulatory Visit: Payer: Self-pay

## 2019-01-05 ENCOUNTER — Ambulatory Visit (INDEPENDENT_AMBULATORY_CARE_PROVIDER_SITE_OTHER): Payer: Medicare Other

## 2019-01-05 DIAGNOSIS — I420 Dilated cardiomyopathy: Secondary | ICD-10-CM | POA: Diagnosis not present

## 2019-01-05 DIAGNOSIS — I110 Hypertensive heart disease with heart failure: Secondary | ICD-10-CM | POA: Diagnosis not present

## 2019-01-05 DIAGNOSIS — I5022 Chronic systolic (congestive) heart failure: Secondary | ICD-10-CM

## 2019-01-05 NOTE — Progress Notes (Signed)
Complete echocardiogram has been performed.  Jimmy Aviyah Swetz RDCS, RVT 

## 2019-01-07 ENCOUNTER — Other Ambulatory Visit: Payer: Self-pay | Admitting: Cardiology

## 2019-01-15 NOTE — Progress Notes (Signed)
Cardiology Office Note:    Date:  01/16/2019   ID:  David, Hester 04-Jan-1946, MRN [id number]  PCP:  Rubie Maid, MD  Cardiologist:  Shirlee More, MD    Referring MD: Rubie Maid, MD    ASSESSMENT:    1. Dilated cardiomyopathy ([place])   2. Hypertensive heart disease with chronic systolic congestive heart failure ([place])   3. Ventricular premature depolarization   4. Mild CAD    PLAN:    In order of problems listed above:  1. He is improved with guideline directed therapy EF greater than 35% New York Heart Association class I after a nice discussion we will hold off on EP consultation and follow closely in the office every 3 months.  The signal for referral would be syncope sustained VT or worsen LV function will recheck echocardiogram 6 months 2. Improved blood pressure target continue current guideline directed therapy including his loop diuretic beta-blocker and Entresto. 3. Stable continue beta-blocker 4. Stable continue current medical treatment I do not feel he requires repeat ischemia evaluation at this time   Next appointment: 3 months   Medication Adjustments/Labs and Tests Ordered: Current medicines are reviewed at length with the patient today.  Concerns regarding medicines are outlined above.  Orders Placed This Encounter  Procedures  . Basic Metabolic Panel (BMET)  . Pro b natriuretic peptide (BNP)   No orders of the defined types were placed in this encounter.   Chief Complaint  Patient presents with  . Follow-up  . Cardiomyopathy  . Congestive Heart Failure    History of Present Illness:    David Hester is a 73 y.o. male with a hx of NHL chemotherapy, mild CAD, PVC;s and severe cardiomyopathy EF 28% MUGA and 25-30% by echo last seen 12/02/2018 virtual visit.  Recent ejection fraction 01/05/2019 showed improvement but remains moderately to severely reduced 35 to 40% ejection fraction on guideline directed medical therapy.   1. The  left ventricle has moderately reduced systolic function, with an ejection fraction of 35-40%. The cavity size was mildly dilated. Left ventricular diastolic Doppler parameters are consistent with impaired relaxation.  2. The right ventricle has normal systolic function. The cavity was normal. There is no increase in right ventricular wall thickness.  3. The aortic root is normal in size and structure.  He did a 3-day ZIO monitor October 2019 that showed occasional arrhythmia 2.2% burden with couplets 41 triplets and episodes of bigeminy and trigeminy.  Compliance with diet, lifestyle and medications: Yes  Overall he is doing well he finds himself fatigued when he works excessively in hot humid weather and asked him to stop.  We had a nice discussion his EF does not fit criteria for an ICD more than EP consultation.  He has no edema or shortness of breath and his diuretic no chest pain palpitation or syncope Past Medical History:  Diagnosis Date  . High cholesterol   . Hypertension   . MI (myocardial infarction) [facility])     Past Surgical History:  Procedure Laterality Date  . BACK SURGERY    . BASAL CELL CARCINOMA EXCISION    . CARDIAC CATHETERIZATION    . CARDIC CATHERERIZATION    . CERVICAL SPINE SURGERY    . HERNIA REPAIR    . I&D EXTREMITY Right 07/31/2013   Procedure: Exploration and Repair Complex Laceration Right Index Finger;  Surgeon: Schuyler Amor, MD;  Location: [facility];  Service: Orthopedics;  Laterality: Right;  . KIDNEY STONE  SURGERY    . KNEE SUGERY     . PR EXPLORATORY OF ABDOMEN   2016  . SHOULDER SURGERY    . SMALL INTESTINE SURGERY      Current Medications: Current Meds  Medication Sig  . acetaminophen (TYLENOL 8 HOUR ARTHRITIS PAIN) 650 MG CR tablet Take 650 mg by mouth every 8 (eight) hours as needed for pain.  Marland Kitchen aspirin 81 MG tablet Take 81 mg by mouth daily.  . carvedilol (COREG) 6.25 MG tablet TAKE 1/2 A TABLET BY MOUTH TWICE A DAY  . cetirizine (ZYRTEC)  10 MG tablet Take 10 mg by mouth daily as needed.  . Cyanocobalamin (VITAMIN B-12) 5000 MCG SUBL Place 1 tablet under the tongue daily.  Marland Kitchen ENTRESTO 97-103 MG TAKE 1 TABLET BY MOUTH TWICE A DAY  . escitalopram (LEXAPRO) 10 MG tablet Take 10 mg by mouth daily.  . furosemide (LASIX) 20 MG tablet Take 1 tablet (20 mg total) by mouth daily. Take additional 20 mg if weight greater than 182lb  . ibuprofen (ADVIL,MOTRIN) 200 MG tablet Take 200 mg by mouth every 6 (six) hours as needed.  . lovastatin (MEVACOR) 10 MG tablet Take 10 mg by mouth daily.   . magnesium 30 MG tablet Take 30 mg by mouth 2 (two) times daily.  . MULTIPLE VITAMIN PO Take 1 tablet by mouth daily.   . nitroGLYCERIN (NITROSTAT) 0.4 MG SL tablet Place 1 tablet (0.4 mg total) under the tongue every 5 (five) minutes as needed for chest pain.  . Omega-3 Fatty Acids (FISH OIL) 1000 MG CAPS Take 1 capsule (1,000 mg total) by mouth daily.  . sildenafil (REVATIO) 20 MG tablet Take 20 mg by mouth daily as needed. 1 - 5 tablets as needed  . testosterone cypionate (DEPO-TESTOSTERONE) 200 MG/ML injection INJECT 1 ML EVERY 3 WEEKS FOR LOW TESTOSTERONE  . traMADol (ULTRAM) 50 MG tablet Take 50 mg by mouth every 8 (eight) hours as needed. for pain  . Wheat Dextrin (BENEFIBER DRINK MIX PO) Take 1 packet by mouth 2 (two) times daily.   Marland Kitchen zolpidem (AMBIEN) 10 MG tablet Take 10 mg by mouth at bedtime as needed for sleep.      Allergies:   Donepezil, Ivp dye [iodinated diagnostic agents], and Sulfa antibiotics   Social History   Socioeconomic History  . Marital status: Married    Spouse name: Not on file  . Number of children: Not on file  . Years of education: Not on file  . Highest education level: Not on file  Occupational History  . Not on file  Social Needs  . Financial resource strain: Not on file  . Food insecurity    Worry: Not on file    Inability: Not on file  . Transportation needs    Medical: Not on file    Non-medical: Not on  file  Tobacco Use  . Smoking status: Never Smoker  . Smokeless tobacco: Former Systems developer    Types: Chew  Substance and Sexual Activity  . Alcohol use: No  . Drug use: No  . Sexual activity: Not on file  Lifestyle  . Physical activity    Days per week: Not on file    Minutes per session: Not on file  . Stress: Not on file  Relationships  . Social Herbalist on phone: Not on file    Gets together: Not on file    Attends religious service: Not on file  Active member of club or organization: Not on file    Attends meetings of clubs or organizations: Not on file    Relationship status: Not on file  Other Topics Concern  . Not on file  Social History Narrative  . Not on file     Family History: The patient's family history includes Breast cancer in his mother; Colon cancer in his brother. ROS:   Please see the history of present illness.    All other systems reviewed and are negative.  EKGs/Labs/Other Studies Reviewed:    The following studies were reviewed today:   Recent Labs: 09/01/2018: BUN 13; Creatinine, Ser 1.14; NT-Pro BNP 672; Potassium 5.1; Sodium 137  Recent Lipid Panel    Component Value Date/Time   CHOL 169 05/14/2018 0905   TRIG 367 (H) 05/14/2018 0905   HDL 31 (L) 05/14/2018 0905   CHOLHDL 5.5 (H) 05/14/2018 0905   LDLCALC 65 05/14/2018 0905    Physical Exam:    VS:  BP 136/74 (BP Location: Left Arm, Patient Position: Sitting, Cuff Size: Normal)   Pulse 84   Temp 97.7 F (36.5 C) (Temporal)   Ht 5\' 10"  (1.778 m)   Wt 181 lb 3.2 oz (82.2 kg)   SpO2 97%   BMI 26.00 kg/m     Wt Readings from Last 3 Encounters:  01/16/19 181 lb 3.2 oz (82.2 kg)  12/02/18 183 lb (83 kg)  09/01/18 187 lb 3.2 oz (84.9 kg)     GEN:  Well nourished, well developed in no acute distress HEENT: Normal NECK: No JVD; No carotid bruits LYMPHATICS: No lymphadenopathy CARDIAC: RRR, no murmurs, rubs, gallops RESPIRATORY:  Clear to auscultation without rales,  wheezing or rhonchi  ABDOMEN: Soft, non-tender, non-distended MUSCULOSKELETAL:  No edema; No deformity  SKIN: Warm and dry NEUROLOGIC:  Alert and oriented x 3 PSYCHIATRIC:  Normal affect    Signed, Shirlee More, MD  01/16/2019 3:27 PM    [facility]

## 2019-01-16 ENCOUNTER — Ambulatory Visit (INDEPENDENT_AMBULATORY_CARE_PROVIDER_SITE_OTHER): Payer: Medicare Other | Admitting: Cardiology

## 2019-01-16 ENCOUNTER — Encounter: Payer: Self-pay | Admitting: Cardiology

## 2019-01-16 ENCOUNTER — Other Ambulatory Visit: Payer: Self-pay

## 2019-01-16 VITALS — BP 136/74 | HR 84 | Temp 97.7°F | Ht 70.0 in | Wt 181.2 lb

## 2019-01-16 DIAGNOSIS — I110 Hypertensive heart disease with heart failure: Secondary | ICD-10-CM

## 2019-01-16 DIAGNOSIS — I493 Ventricular premature depolarization: Secondary | ICD-10-CM

## 2019-01-16 DIAGNOSIS — I5022 Chronic systolic (congestive) heart failure: Secondary | ICD-10-CM

## 2019-01-16 DIAGNOSIS — I2510 Atherosclerotic heart disease of native coronary artery without angina pectoris: Secondary | ICD-10-CM | POA: Diagnosis not present

## 2019-01-16 DIAGNOSIS — I420 Dilated cardiomyopathy: Secondary | ICD-10-CM

## 2019-01-16 NOTE — Patient Instructions (Signed)
Medication Instructions:  Your physician recommends that you continue on your current medications as directed. Please refer to the Current Medication list given to you today.  If you need a refill on your cardiac medications before your next appointment, please call your pharmacy.   Lab work: Your physician recommends that you have a ProBNP and BMP drawn.   If you have labs (blood work) drawn today and your tests are completely normal, you will receive your results only by: Marland Kitchen MyChart Message (if you have MyChart) OR . A paper copy in the mail If you have any lab test that is abnormal or we need to change your treatment, we will call you to review the results.  Testing/Procedures: NONE  Follow-Up: At [facility], you and your health needs are our priority.  As part of our continuing mission to provide you with exceptional heart care, we have created designated Provider Care Teams.  These Care Teams include your primary Cardiologist (physician) and Advanced Practice Providers (APPs -  Physician Assistants and Nurse Practitioners) who all work together to provide you with the care you need, when you need it. You will need a follow up appointment in 3 months.

## 2019-01-17 LAB — BASIC METABOLIC PANEL
BUN/Creatinine Ratio: 13 (ref 10–24)
BUN: 17 mg/dL (ref 8–27)
CO2: 26 mmol/L (ref 20–29)
Calcium: 9.2 mg/dL (ref 8.6–10.2)
Chloride: 99 mmol/L (ref 96–106)
Creatinine, Ser: 1.28 mg/dL — ABNORMAL HIGH (ref 0.76–1.27)
GFR calc Af Amer: 63 mL/min/{1.73_m2} (ref >59)
GFR calc non Af Amer: 54 mL/min/{1.73_m2} — ABNORMAL LOW (ref >59)
Glucose: 58 mg/dL — ABNORMAL LOW (ref 65–99)
Potassium: 5.3 mmol/L — ABNORMAL HIGH (ref 3.5–5.2)
Sodium: 143 mmol/L (ref 134–144)

## 2019-01-17 LAB — PRO B NATRIURETIC PEPTIDE: NT-Pro BNP: 150 pg/mL (ref 0–486)

## 2019-01-19 ENCOUNTER — Telehealth: Payer: Self-pay | Admitting: *Deleted

## 2019-01-19 DIAGNOSIS — I110 Hypertensive heart disease with heart failure: Secondary | ICD-10-CM

## 2019-01-19 NOTE — Telephone Encounter (Signed)
Telephone call to patient. Spoke with wife . Informed of lab results and need to repeat BMP in 1 Month

## 2019-01-19 NOTE — Telephone Encounter (Signed)
-----   Message from Richardo Priest, MD sent at 01/17/2019  9:20 AM EDT ----- Good result by K is a little high  Recheck bmp 1 month

## 2019-01-21 ENCOUNTER — Other Ambulatory Visit: Payer: Self-pay | Admitting: Cardiology

## 2019-02-16 LAB — BASIC METABOLIC PANEL
BUN/Creatinine Ratio: 17 (ref 10–24)
BUN: 25 mg/dL (ref 8–27)
CO2: 21 mmol/L (ref 20–29)
Calcium: 9.3 mg/dL (ref 8.6–10.2)
Chloride: 102 mmol/L (ref 96–106)
Creatinine, Ser: 1.46 mg/dL — ABNORMAL HIGH (ref 0.76–1.27)
GFR calc Af Amer: 54 mL/min/{1.73_m2} — ABNORMAL LOW (ref >59)
GFR calc non Af Amer: 46 mL/min/{1.73_m2} — ABNORMAL LOW (ref >59)
Glucose: 194 mg/dL — ABNORMAL HIGH (ref 65–99)
Potassium: 5 mmol/L (ref 3.5–5.2)
Sodium: 138 mmol/L (ref 134–144)

## 2019-02-17 ENCOUNTER — Other Ambulatory Visit: Payer: Self-pay | Admitting: Cardiology

## 2019-04-03 ENCOUNTER — Other Ambulatory Visit: Payer: Self-pay

## 2019-04-03 MED ORDER — FUROSEMIDE 20 MG PO TABS: 20.0000 mg | ORAL_TABLET | Freq: Every day | ORAL | 0 refills | Status: DC

## 2019-04-22 ENCOUNTER — Other Ambulatory Visit: Payer: Self-pay | Admitting: Cardiology

## 2019-04-22 NOTE — Progress Notes (Signed)
Cardiology Office Note:    Date:  04/23/2019   ID:  [name], [name] May 08, 1946, MRN [id number]  PCP:  Rubie Maid, MD  Cardiologist:  Shirlee More, MD    Referring MD: Rubie Maid, MD    ASSESSMENT:    1. Dilated cardiomyopathy ([place])   2. Hypertensive heart disease with chronic systolic congestive heart failure ([place])   3. Ventricular premature depolarization   4. Mild CAD    PLAN:    In order of problems listed above:  1. Stable New York Heart Association class I to class II on good guideline directed medical therapy continue the same check labs including proBNP close supervision of volume status by his wife and nurse and plan echocardiogram on the time of his next visit. 2. Stable continue guideline directed therapy 3. Stable asymptomatic continue beta-blocker 4. Stable continue medical treatment including aspirin statin beta-blocker   Next appointment: 6 months   Medication Adjustments/Labs and Tests Ordered: Current medicines are reviewed at length with the patient today.  Concerns regarding medicines are outlined above.  No orders of the defined types were placed in this encounter.  No orders of the defined types were placed in this encounter.   Chief Complaint  Patient presents with  . Follow-up  . Cardiomyopathy  . Congestive Heart Failure    History of Present Illness:    David Hester is a 73 y.o. male with a hx of NHL chemotherapy, mild CAD, PVC;s and severe cardiomyopathy EF 28% MUGA and 25-30% by echo last seen 12/02/2018 virtual visit.    His most recent ejection fraction 01/05/2019 showed improvement but remains moderately to severely reduced 35 to 40% ejection fraction on guideline directed medical therapy.  He was last seen on 01/16/2019. 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  Only on one occasion as needed extra dose of diuretic he is fatigued but is active physically no  edema shortness of breath orthopnea chest pain palpitation or syncope.  We will plan to repeat echocardiogram around the time of his next visit.  Today check labs renal function potassium proBNP and liver function lipids with statin therapy.  Ref Range & Units 65mo ago 2mo ago 70mo ago   NT-Pro BNP 0 - 486 pg/mL 150  672High  CM  272     Past Medical History:  Diagnosis Date  . High cholesterol   . Hypertension   . MI (myocardial infarction) [facility])     Past Surgical History:  Procedure Laterality Date  . BACK SURGERY    . BASAL CELL CARCINOMA EXCISION    . CARDIAC CATHETERIZATION    . CARDIC CATHERERIZATION    . CERVICAL SPINE SURGERY    . HERNIA REPAIR    . I&D EXTREMITY Right 07/31/2013   Procedure: Exploration and Repair Complex Laceration Right Index Finger;  Surgeon: Schuyler Amor, MD;  Location: [facility];  Service: Orthopedics;  Laterality: Right;  . KIDNEY STONE SURGERY    . KNEE SUGERY     . PR EXPLORATORY OF ABDOMEN   2016  . SHOULDER SURGERY    . SMALL INTESTINE SURGERY      Current Medications: Current Meds  Medication Sig  . acetaminophen (TYLENOL 8 HOUR ARTHRITIS PAIN) 650 MG CR tablet Take 650 mg by mouth every 8 (eight) hours as needed for pain.  Marland Kitchen aspirin 81 MG tablet Take 81 mg by mouth daily.  . carvedilol (COREG) 6.25 MG tablet  TAKE 1/2 A TABLET BY MOUTH TWICE A DAY  . cetirizine (ZYRTEC) 10 MG tablet Take 10 mg by mouth daily as needed.  . Cyanocobalamin (VITAMIN B-12) 5000 MCG SUBL Place 1 tablet under the tongue daily.  Marland Kitchen ENTRESTO 97-103 MG TAKE 1 TABLET BY MOUTH TWICE A DAY  . escitalopram (LEXAPRO) 10 MG tablet Take 10 mg by mouth daily.  . furosemide (LASIX) 20 MG tablet Take 1 tablet (20 mg total) by mouth daily. Take additional 20 mg if weight greater than 182lb  . ibuprofen (ADVIL,MOTRIN) 200 MG tablet Take 200 mg by mouth every 6 (six) hours as needed.  . lovastatin (MEVACOR) 10 MG tablet Take 10 mg by mouth once a week.  . MULTIPLE VITAMIN PO  Take 1 tablet by mouth daily.   . nitroGLYCERIN (NITROSTAT) 0.4 MG SL tablet Place 1 tablet (0.4 mg total) under the tongue every 5 (five) minutes as needed for chest pain.  . Omega-3 Fatty Acids (FISH OIL) 1000 MG CAPS Take 1 capsule (1,000 mg total) by mouth daily.  . sildenafil (REVATIO) 20 MG tablet Take 20 mg by mouth daily as needed. 1 - 5 tablets as needed  . testosterone cypionate (DEPO-TESTOSTERONE) 200 MG/ML injection INJECT 1 ML EVERY 3 WEEKS FOR LOW TESTOSTERONE  . traMADol (ULTRAM) 50 MG tablet Take 50 mg by mouth every 8 (eight) hours as needed. for pain  . Wheat Dextrin (BENEFIBER DRINK MIX PO) Take 1 packet by mouth 2 (two) times daily.   Marland Kitchen zolpidem (AMBIEN) 10 MG tablet Take 10 mg by mouth at bedtime as needed for sleep.      Allergies:   Donepezil, Ivp dye [iodinated diagnostic agents], and Sulfa antibiotics   Social History   Socioeconomic History  . Marital status: Married    Spouse name: Not on file  . Number of children: Not on file  . Years of education: Not on file  . Highest education level: Not on file  Occupational History  . Not on file  Social Needs  . Financial resource strain: Not on file  . Food insecurity    Worry: Not on file    Inability: Not on file  . Transportation needs    Medical: Not on file    Non-medical: Not on file  Tobacco Use  . Smoking status: Never Smoker  . Smokeless tobacco: Never Used  Substance and Sexual Activity  . Alcohol use: No  . Drug use: No  . Sexual activity: Not on file  Lifestyle  . Physical activity    Days per week: Not on file    Minutes per session: Not on file  . Stress: Not on file  Relationships  . Social Herbalist on phone: Not on file    Gets together: Not on file    Attends religious service: Not on file    Active member of club or organization: Not on file    Attends meetings of clubs or organizations: Not on file    Relationship status: Not on file  Other Topics Concern  . Not on  file  Social History Narrative  . Not on file     Family History: The patient's family history includes Breast cancer in his mother; Colon cancer in his brother. ROS:   Please see the history of present illness.    All other systems reviewed and are negative.  EKGs/Labs/Other Studies Reviewed:    The following studies were reviewed today:  EKG:  EKG  ordered today and personally reviewed.  The ekg ordered today demonstrates sinus rhythm old inferior infarction no ischemic changes normal QT interval  Recent Labs: 01/16/2019: NT-Pro BNP 150 02/16/2019: BUN 25; Creatinine, Ser 1.46; Potassium 5.0; Sodium 138  Recent Lipid Panel    Component Value Date/Time   CHOL 169 05/14/2018 0905   TRIG 367 (H) 05/14/2018 0905   HDL 31 (L) 05/14/2018 0905   CHOLHDL 5.5 (H) 05/14/2018 0905   LDLCALC 65 05/14/2018 0905    Physical Exam:    VS:  BP 134/82 (BP Location: Right Arm, Patient Position: Sitting, Cuff Size: Normal)   Pulse 76   Ht 5\' 10"  (1.778 m)   Wt 184 lb 3.2 oz (83.6 kg)   SpO2 95%   BMI 26.43 kg/m     Wt Readings from Last 3 Encounters:  04/23/19 184 lb 3.2 oz (83.6 kg)  01/16/19 181 lb 3.2 oz (82.2 kg)  12/02/18 183 lb (83 kg)     GEN:  Well nourished, well developed in no acute distress HEENT: Normal NECK: No JVD; No carotid bruits LYMPHATICS: No lymphadenopathy CARDIAC: RRR, no murmurs, rubs, gallops RESPIRATORY:  Clear to auscultation without rales, wheezing or rhonchi  ABDOMEN: Soft, non-tender, non-distended MUSCULOSKELETAL:  No edema; No deformity  SKIN: Warm and dry NEUROLOGIC:  Alert and oriented x 3 PSYCHIATRIC:  Normal affect    Signed, Shirlee More, MD  04/23/2019 2:56 PM    [facility]

## 2019-04-23 ENCOUNTER — Other Ambulatory Visit: Payer: Self-pay

## 2019-04-23 ENCOUNTER — Encounter: Payer: Self-pay | Admitting: Cardiology

## 2019-04-23 ENCOUNTER — Ambulatory Visit (INDEPENDENT_AMBULATORY_CARE_PROVIDER_SITE_OTHER): Payer: Medicare Other | Admitting: Cardiology

## 2019-04-23 VITALS — BP 134/82 | HR 76 | Ht 70.0 in | Wt 184.2 lb

## 2019-04-23 DIAGNOSIS — I2510 Atherosclerotic heart disease of native coronary artery without angina pectoris: Secondary | ICD-10-CM | POA: Diagnosis not present

## 2019-04-23 DIAGNOSIS — I493 Ventricular premature depolarization: Secondary | ICD-10-CM | POA: Diagnosis not present

## 2019-04-23 DIAGNOSIS — I420 Dilated cardiomyopathy: Secondary | ICD-10-CM

## 2019-04-23 DIAGNOSIS — I110 Hypertensive heart disease with heart failure: Secondary | ICD-10-CM

## 2019-04-23 DIAGNOSIS — I5022 Chronic systolic (congestive) heart failure: Secondary | ICD-10-CM

## 2019-04-23 DIAGNOSIS — E785 Hyperlipidemia, unspecified: Secondary | ICD-10-CM

## 2019-04-23 NOTE — Patient Instructions (Signed)
Medication Instructions:  Your physician recommends that you continue on your current medications as directed. Please refer to the Current Medication list given to you today.  *If you need a refill on your cardiac medications before your next appointment, please call your pharmacy*  Lab Work: Your physician recommends that you return for lab work today: lipid panel, CMP, ProBNP.   If you have labs (blood work) drawn today and your tests are completely normal, you will receive your results only by: Marland Kitchen MyChart Message (if you have MyChart) OR . A paper copy in the mail If you have any lab test that is abnormal or we need to change your treatment, we will call you to review the results.  Testing/Procedures: You had an EKG today.   Follow-Up: At [facility], you and your health needs are our priority.  As part of our continuing mission to provide you with exceptional heart care, we have created designated Provider Care Teams.  These Care Teams include your primary Cardiologist (physician) and Advanced Practice Providers (APPs -  Physician Assistants and Nurse Practitioners) who all work together to provide you with the care you need, when you need it.  Your next appointment:   6 months  The format for your next appointment:   In Person  Provider:   Shirlee More, MD

## 2019-04-24 LAB — COMPREHENSIVE METABOLIC PANEL
ALT: 12 IU/L (ref 0–44)
AST: 18 IU/L (ref 0–40)
Albumin/Globulin Ratio: 1.7 (ref 1.2–2.2)
Albumin: 4.6 g/dL (ref 3.7–4.7)
Alkaline Phosphatase: 58 IU/L (ref 39–117)
BUN/Creatinine Ratio: 17 (ref 10–24)
BUN: 23 mg/dL (ref 8–27)
Bilirubin Total: 0.4 mg/dL (ref 0.0–1.2)
CO2: 25 mmol/L (ref 20–29)
Calcium: 9.6 mg/dL (ref 8.6–10.2)
Chloride: 97 mmol/L (ref 96–106)
Creatinine, Ser: 1.33 mg/dL — ABNORMAL HIGH (ref 0.76–1.27)
GFR calc Af Amer: 60 mL/min/{1.73_m2} (ref >59)
GFR calc non Af Amer: 52 mL/min/{1.73_m2} — ABNORMAL LOW (ref >59)
Globulin, Total: 2.7 g/dL (ref 1.5–4.5)
Glucose: 67 mg/dL (ref 65–99)
Potassium: 4.7 mmol/L (ref 3.5–5.2)
Sodium: 138 mmol/L (ref 134–144)
Total Protein: 7.3 g/dL (ref 6.0–8.5)

## 2019-04-24 LAB — LIPID PANEL
Chol/HDL Ratio: 6.7 ratio — ABNORMAL HIGH (ref 0.0–5.0)
Cholesterol, Total: 228 mg/dL — ABNORMAL HIGH (ref 100–199)
HDL: 34 mg/dL — ABNORMAL LOW (ref >39)
LDL Chol Calc (NIH): 100 mg/dL — ABNORMAL HIGH (ref 0–99)
Triglycerides: 558 mg/dL (ref 0–149)
VLDL Cholesterol Cal: 94 mg/dL — ABNORMAL HIGH (ref 5–40)

## 2019-04-24 LAB — PRO B NATRIURETIC PEPTIDE: NT-Pro BNP: 260 pg/mL (ref 0–486)

## 2019-05-12 ENCOUNTER — Other Ambulatory Visit: Payer: Self-pay | Admitting: Cardiology

## 2019-05-20 DIAGNOSIS — Z8572 Personal history of non-Hodgkin lymphomas: Secondary | ICD-10-CM | POA: Diagnosis not present

## 2019-06-02 ENCOUNTER — Other Ambulatory Visit: Payer: Self-pay | Admitting: Cardiology

## 2019-06-30 DIAGNOSIS — R29818 Other symptoms and signs involving the nervous system: Secondary | ICD-10-CM | POA: Insufficient documentation

## 2019-07-18 ENCOUNTER — Other Ambulatory Visit: Payer: Self-pay | Admitting: Cardiology

## 2019-10-19 ENCOUNTER — Other Ambulatory Visit: Payer: Self-pay

## 2019-10-19 NOTE — Progress Notes (Signed)
Cardiology Office Note:    Date:  10/20/2019   ID:  David Hester, David Hester David Hester 06, 1947, MRN [id number]  PCP:  David Maid, MD  Cardiologist:  David More, MD    Referring MD: David Maid, MD    ASSESSMENT:    1. Dilated cardiomyopathy ([place])   2. Hypertensive heart disease with chronic systolic congestive heart failure ()   3. Mild CAD   4. Ventricular premature depolarization    PLAN:    In order of problems listed above:  1. Continue current guideline directed therapy recheck EF remains severely reduced considering ICD. 2. Stable tolerating his primary heart failure compensated 3. CAD stable continue medical therapy for progressive ischemic evaluation 4. Stable asymptomatic continue beta-blocker   Next appointment: 6 months   Medication Adjustments/Labs and Tests Ordered: Current medicines are reviewed at length with the patient today.  Concerns regarding medicines are outlined above.  No orders of the defined types were placed in this encounter.  No orders of the defined types were placed in this encounter.   Chief Complaint  Patient presents with  . 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 male with a hx of NHL chemotherapy, mild CAD, PVC;s and severe cardiomyopathy EF 28% MUGA and 25-30% by echo last seen 12/02/2018 virtual visit.    His most recent ejection fraction 01/05/2019 showed improvement but remains moderately to severely reduced 35 to 40% ejection fraction on guideline directed medical therapy.    He was last seen 04/23/2019 and I initiated discussion of ICD therapy. Compliance with diet, lifestyle and medications: Yes  He has mild dementia his wife is an Therapist, sports and she supervises him in his medications.  Rarely needs to take an extra dose of diuretic and has had no edema orthopnea chest pain palpitation or syncope.  He works  persistently outdoors in the hot weather at times he needs to stop and rest.  Order to recheck his ejection fraction for remains less then 35 to 40% consider ICD therapy.  We have not uptitrated as he has had hypotension in the past and really keep his present dose of Entresto carvedilol and not add MRA.  His proBNP level has been low and stable.  5 mo ago  (04/23/19) 9 mo ago  (01/16/19) 1 yr ago  (09/01/18) 1 yr ago  (06/20/18)   NT-Pro BNP 0 - 486 pg/mL 260  150 CM  672High  CM  272 R, CM    Past Medical History:  Diagnosis Date  . Bilateral wrist pain 07/11/2015  . CAD (coronary artery disease), native coronary artery 03/09/2015   single vessel 60 percent PDA stenosis by previous catheterization. single vessel 60 percent PDA stenosis by previous catheterization.  Formatting of this note might be different from the original. single vessel 60 percent PDA stenosis by previous catheterization. Formatting of this note might be different from the original. Overview:  single vessel 60 percent PDA stenosis by previous catheterizati  . Cognitive changes 07/11/2015  . Dilated cardiomyopathy ([place]) 03/20/2018  . Glucosuria 08/20/2018  . High cholesterol   . 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 History of small bowel obstruction 03/30/2015  . Hyperglycemia 03/30/2015  . Hyperlipidemia 03/09/2015  . Hypertension   . Hypertensive heart disease 03/09/2015  . Insomnia 07/11/2015  . Medicare annual wellness visit, subsequent 03/29/2016  . MI (myocardial infarction) ([place])   .  Moderate episode of recurrent major depressive disorder ([place]) 03/29/2016  . Personal history of cerebral atherosclerosis 06/25/2017  . Small bowel obstruction ([place]) 03/30/2015  . 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  . Ventricular premature depolarization 03/09/2015    Past Surgical History:  Procedure Laterality Date  . BACK SURGERY    . BASAL CELL CARCINOMA EXCISION    . CARDIAC CATHETERIZATION    . CARDIC CATHERERIZATION    .  CERVICAL SPINE SURGERY    . HERNIA REPAIR    . I & D EXTREMITY Right 07/31/2013   Procedure: Exploration and Repair Complex Laceration Right Index Finger;  Surgeon: David Amor, MD;  Location: [place];  Service: Orthopedics;  Laterality: Right;  . KIDNEY STONE SURGERY    . KNEE SUGERY     . PR EXPLORATORY OF ABDOMEN   2016  . SHOULDER SURGERY    . SMALL INTESTINE SURGERY      Current Medications: Current Meds  Medication Sig  . acetaminophen (TYLENOL 8 HOUR ARTHRITIS PAIN) 650 MG CR tablet Take 650 mg by mouth every 8 (eight) hours as needed for pain.  Marland Kitchen aspirin 81 MG tablet Take 81 mg by mouth daily.  Marland Kitchen b complex vitamins tablet Take 1 tablet by mouth daily.  . carvedilol (COREG) 6.25 MG tablet TAKE 1/2 TABLET BY MOUTH TWICE A DAY  . cetirizine (ZYRTEC) 10 MG tablet Take 10 mg by mouth daily as needed.  Marland Kitchen ENTRESTO 97-103 MG TAKE 1 TABLET BY MOUTH TWICE A DAY  . escitalopram (LEXAPRO) 10 MG tablet Take 10 mg by mouth daily.  . furosemide (LASIX) 20 MG tablet TAKE 1 TABLET (20 MG TOTAL) BY MOUTH DAILY. TAKE ADDITIONAL 20 MG IF WEIGHT GREATER THAN 182LB  . ibuprofen (ADVIL,MOTRIN) 200 MG tablet Take 200 mg by mouth every 6 (six) hours as needed.  . lovastatin (MEVACOR) 10 MG tablet Take 10 mg by mouth daily.   . MULTIPLE VITAMIN PO Take 1 tablet by mouth daily.   . nitroGLYCERIN (NITROSTAT) 0.4 MG SL tablet Place 1 tablet (0.4 mg total) under the tongue every 5 (five) minutes as needed for chest pain.  . sildenafil (REVATIO) 20 MG tablet Take 20 mg by mouth daily as needed. 1 - 5 tablets as needed  . testosterone cypionate (DEPOTESTOSTERONE CYPIONATE) 200 MG/ML injection Inject 1 mL into the skin every 21 ( twenty-one) days.  . traMADol (ULTRAM) 50 MG tablet Take 50 mg by mouth every 8 (eight) hours as needed. for pain  . Wheat Dextrin (BENEFIBER DRINK MIX PO) Take 1 packet by mouth 2 (two) times daily.   Marland Kitchen zolpidem (AMBIEN) 10 MG tablet Take 10 mg by mouth at bedtime as needed for  sleep.      Allergies:   Donepezil, Ivp dye [iodinated diagnostic agents], and Sulfa antibiotics   Social History   Socioeconomic History  . Marital status: Married    Spouse name: Not on file  . Number of children: Not on file  . Years of education: Not on file  . Highest education level: Not on file  Occupational History  . Not on file  Tobacco Use  . Smoking status: Never Smoker  . Smokeless tobacco: Never Used  Substance and Sexual Activity  . Alcohol use: No  . Drug use: No  . Sexual activity: Not on file  Other Topics Concern  . Not on file  Social History Narrative  . Not on file   Social Determinants of Health   Financial Resource Strain:   .  Difficulty of Paying Living Expenses:   Food Insecurity:   . Worried About Charity fundraiser in the Last Year:   . Arboriculturist in the Last Year:   Transportation Needs:   . Film/video editor (Medical):   Marland Kitchen Lack of Transportation (Non-Medical):   Physical Activity:   . Days of Exercise per Week:   . Minutes of Exercise per Session:   Stress:   . Feeling of Stress :   Social Connections:   . Frequency of Communication with Friends and Family:   . Frequency of Social Gatherings with Friends and Family:   . Attends Religious Services:   . Active Member of Clubs or Organizations:   . Attends Archivist Meetings:   Marland Kitchen Marital Status:      Family History: The patient's family history includes Breast cancer in his mother; Colon cancer in his brother. ROS:   Please see the history of present illness.    All other systems reviewed and are negative.  EKGs/Labs/Other Studies Reviewed:    The following studies were reviewed today:    Recent Labs: 04/23/2019: ALT 12; BUN 23; Creatinine, Ser 1.33; NT-Pro BNP 260; Potassium 4.7; Sodium 138  Recent Lipid Panel    Component Value Date/Time   CHOL 228 (H) 04/23/2019 1508   TRIG 558 (HH) 04/23/2019 1508   HDL 34 (L) 04/23/2019 1508   CHOLHDL 6.7 (H)  04/23/2019 1508   LDLCALC 100 (H) 04/23/2019 1508    Physical Exam:    VS:  BP 128/70   Pulse 76   Ht 5\' 10"  (1.778 m)   Wt 187 lb (84.8 kg)   BMI 26.83 kg/m     Wt Readings from Last 3 Encounters:  10/20/19 187 lb (84.8 kg)  04/23/19 184 lb 3.2 oz (83.6 kg)  01/16/19 181 lb 3.2 oz (82.2 kg)     GEN:  Well nourished, well developed in no acute distress HEENT: Normal NECK: No JVD; No carotid bruits LYMPHATICS: No lymphadenopathy CARDIAC: RRR, no murmurs, rubs, gallops RESPIRATORY:  Clear to auscultation without rales, wheezing or rhonchi  ABDOMEN: Soft, non-tender, non-distended MUSCULOSKELETAL:  No edema; No deformity  SKIN: Warm and dry NEUROLOGIC:  Alert and oriented x 3 PSYCHIATRIC:  Normal affect    Signed, David More, MD  10/20/2019 11:01 AM    [place]

## 2019-10-20 ENCOUNTER — Ambulatory Visit (INDEPENDENT_AMBULATORY_CARE_PROVIDER_SITE_OTHER): Payer: Medicare Other | Admitting: Cardiology

## 2019-10-20 ENCOUNTER — Encounter: Payer: Self-pay | Admitting: Cardiology

## 2019-10-20 ENCOUNTER — Other Ambulatory Visit: Payer: Self-pay

## 2019-10-20 VITALS — BP 128/70 | HR 76 | Ht 70.0 in | Wt 187.0 lb

## 2019-10-20 DIAGNOSIS — I110 Hypertensive heart disease with heart failure: Secondary | ICD-10-CM

## 2019-10-20 DIAGNOSIS — I5022 Chronic systolic (congestive) heart failure: Secondary | ICD-10-CM

## 2019-10-20 DIAGNOSIS — I420 Dilated cardiomyopathy: Secondary | ICD-10-CM

## 2019-10-20 DIAGNOSIS — I493 Ventricular premature depolarization: Secondary | ICD-10-CM

## 2019-10-20 DIAGNOSIS — I2510 Atherosclerotic heart disease of native coronary artery without angina pectoris: Secondary | ICD-10-CM | POA: Diagnosis not present

## 2019-10-20 NOTE — Patient Instructions (Signed)
Medication Instructions:  Your physician recommends that you continue on your current medications as directed. Please refer to the Current Medication list given to you today.  *If you need a refill on your cardiac medications before your next appointment, please call your pharmacy*   Lab Work: Your physician recommends that you return for lab work in: TODAY CMP, Lipids, ProBNP If you have labs (blood work) drawn today and your tests are completely normal, you will receive your results only by: MyChart Message (if you have MyChart) OR A paper copy in the mail If you have any lab test that is abnormal or we need to change your treatment, we will call you to review the results.   Testing/Procedures: Your physician has requested that you have an echocardiogram. Echocardiography is a painless test that uses sound waves to create images of your heart. It provides your doctor with information about the size and shape of your heart and how well your heart's chambers and valves are working. This procedure takes approximately one hour. There are no restrictions for this procedure.    Follow-Up: At [facility], you and your health needs are our priority.  As part of our continuing mission to provide you with exceptional heart care, we have created designated Provider Care Teams.  These Care Teams include your primary Cardiologist (physician) and Advanced Practice Providers (APPs -  Physician Assistants and Nurse Practitioners) who all work together to provide you with the care you need, when you need it.  We recommend signing up for the patient portal called "MyChart".  Sign up information is provided on this After Visit Summary.  MyChart is used to connect with patients for Virtual Visits (Telemedicine).  Patients are able to view lab/test results, encounter notes, upcoming appointments, etc.  Non-urgent messages can be sent to your provider as well.   To learn more about what you can do with  MyChart, go to [email or web address].    Your next appointment:   6 month(s)  The format for your next appointment:   In Person  Provider:   Brian Munley, MD   Other Instructions   

## 2019-10-21 ENCOUNTER — Telehealth: Payer: Self-pay

## 2019-10-21 LAB — COMPREHENSIVE METABOLIC PANEL
ALT: 17 IU/L (ref 0–44)
AST: 20 IU/L (ref 0–40)
Albumin/Globulin Ratio: 1.8 (ref 1.2–2.2)
Albumin: 4.2 g/dL (ref 3.7–4.7)
Alkaline Phosphatase: 53 IU/L (ref 39–117)
BUN/Creatinine Ratio: 17 (ref 10–24)
BUN: 24 mg/dL (ref 8–27)
Bilirubin Total: 0.3 mg/dL (ref 0.0–1.2)
CO2: 22 mmol/L (ref 20–29)
Calcium: 8.9 mg/dL (ref 8.6–10.2)
Chloride: 103 mmol/L (ref 96–106)
Creatinine, Ser: 1.45 mg/dL — ABNORMAL HIGH (ref 0.76–1.27)
GFR calc Af Amer: 54 mL/min/{1.73_m2} — ABNORMAL LOW (ref >59)
GFR calc non Af Amer: 46 mL/min/{1.73_m2} — ABNORMAL LOW (ref >59)
Globulin, Total: 2.4 g/dL (ref 1.5–4.5)
Glucose: 78 mg/dL (ref 65–99)
Potassium: 4.4 mmol/L (ref 3.5–5.2)
Sodium: 139 mmol/L (ref 134–144)
Total Protein: 6.6 g/dL (ref 6.0–8.5)

## 2019-10-21 LAB — LIPID PANEL
Chol/HDL Ratio: 5.5 ratio — ABNORMAL HIGH (ref 0.0–5.0)
Cholesterol, Total: 176 mg/dL (ref 100–199)
HDL: 32 mg/dL — ABNORMAL LOW (ref >39)
LDL Chol Calc (NIH): 93 mg/dL (ref 0–99)
Triglycerides: 302 mg/dL — ABNORMAL HIGH (ref 0–149)
VLDL Cholesterol Cal: 51 mg/dL — ABNORMAL HIGH (ref 5–40)

## 2019-10-21 LAB — PRO B NATRIURETIC PEPTIDE: NT-Pro BNP: 245 pg/mL (ref 0–486)

## 2019-10-21 NOTE — Telephone Encounter (Signed)
Spoke with patient regarding results and recommendation.  Patient verbalizes understanding and is agreeable to plan of care. Advised patient to call back with any issues or concerns.  

## 2019-10-21 NOTE — Telephone Encounter (Signed)
-----   Message from Richardo Priest, MD sent at 10/21/2019 12:54 PM EDT ----- Normal or stable result  No changes

## 2019-10-25 ENCOUNTER — Other Ambulatory Visit: Payer: Self-pay | Admitting: Cardiology

## 2019-11-05 ENCOUNTER — Other Ambulatory Visit: Payer: Self-pay

## 2019-11-05 ENCOUNTER — Ambulatory Visit (INDEPENDENT_AMBULATORY_CARE_PROVIDER_SITE_OTHER): Payer: Medicare Other

## 2019-11-05 DIAGNOSIS — I420 Dilated cardiomyopathy: Secondary | ICD-10-CM

## 2019-11-06 ENCOUNTER — Telehealth: Payer: Self-pay

## 2019-11-06 NOTE — Telephone Encounter (Signed)
Follow up ° ° °Pt's wife returning call °

## 2019-11-06 NOTE — Telephone Encounter (Signed)
-----   Message from Richardo Priest, MD sent at 11/06/2019 11:58 AM EDT ----- Normal or stable result  I think this is a good result his ejection fraction remains above the cutoff of less than 35% is indication for an ICD with chemotherapy related cardiomyopathy any sustained improvement from his baseline of 25 to 30% at this time and continue the medications and will continue to follow his echocardiogram at least yearly.  Please give the message to his wife she is a retired Marine scientist and he has a mild degree of dementia.

## 2019-11-06 NOTE — Telephone Encounter (Signed)
Spoke with patients wife regarding results and recommendation. ° °She verbalizes understanding and is agreeable to plan of care. Advised patient to call back with any issues or concerns.  °

## 2019-11-06 NOTE — Progress Notes (Signed)
Normal or stable result  I think this is a good result his ejection fraction remains above the cutoff of less than 35% is indication for an ICD with chemotherapy related cardiomyopathy any sustained improvement from his baseline of 25 to 30% at this time and continue the medications and will continue to follow his echocardiogram at least yearly.  Please give the message to his wife she is a retired Marine scientist and he has a mild degree of dementia.

## 2019-11-06 NOTE — Telephone Encounter (Signed)
Left message on patients voicemail to please return our call.   

## 2020-01-07 ENCOUNTER — Other Ambulatory Visit: Payer: Self-pay | Admitting: Cardiology

## 2020-04-22 ENCOUNTER — Ambulatory Visit: Payer: Medicare Other | Admitting: Cardiology

## 2020-04-25 ENCOUNTER — Other Ambulatory Visit: Payer: Self-pay | Admitting: Cardiology

## 2020-05-01 ENCOUNTER — Other Ambulatory Visit: Payer: Self-pay | Admitting: Cardiology

## 2020-05-03 ENCOUNTER — Other Ambulatory Visit: Payer: Self-pay

## 2020-05-04 NOTE — Progress Notes (Deleted)
Cardiology Office Note:    Date:  05/04/2020   ID:  David Hester, [place] February 18, 1946, MRN [id number]  PCP:  Rubie Maid, MD  Cardiologist:  Shirlee More, MD    Referring MD: Rubie Maid, MD    ASSESSMENT:    No diagnosis found. PLAN:    In order of problems listed above:  1. ***   Next appointment: ***   Medication Adjustments/Labs and Tests Ordered: Current medicines are reviewed at length with the patient today.  Concerns regarding medicines are outlined above.  No orders of the defined types were placed in this encounter.  No orders of the defined types were placed in this encounter.   No chief complaint on file.   History of Present Illness:    Kevis Qu is a 74 y.o. male with a hx of non-Hodgkin's lymphoma severe cardiomyopathy related to chemotherapy previous ejection fraction gated pull 28% echo 25 to 30% with improvement with guideline directed therapy.  Other problems include mild CAD PVCs and cognitive changes.  He was last seen 10/20/2018. Compliance with diet, lifestyle and medications: *** Echocardiogram 11/05/2019 showed EF 35 to 40% with diffuse hypokinesia and normal diastolic filling pressures. His proBNP level remained stable and low  6 mo ago  (10/20/19) 1 yr ago  (04/23/19) 1 yr ago  (01/16/19)   NT-Pro BNP 0 - 486 pg/mL 245  260 CM  150 CM    Recent labs [facility] primary care 03/22/2020: CMP potassium 5.0 sodium 139 creatinine 1.50 GFR 45 cc normal liver function test Cholesterol 209 LDL 110 triglycerides 403 HDL 35 Hemoglobin 16.7 platelets 236,000 Past Medical History:  Diagnosis Date  . Bilateral wrist pain 07/11/2015  . CAD (coronary artery disease), native coronary artery 03/09/2015   single vessel 60 percent PDA stenosis by previous catheterization. single vessel 60 percent PDA stenosis by previous catheterization.  Formatting of this note might be different from the original. single vessel 60 percent  PDA stenosis by previous catheterization. Formatting of this note might be different from the original. Overview:  single vessel 60 percent PDA stenosis by previous catheterizati  . Cognitive changes 07/11/2015  . Dilated cardiomyopathy ([place]) 03/20/2018  . Glucosuria 08/20/2018  . High cholesterol   . 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 History of small bowel obstruction 03/30/2015  . Hyperglycemia 03/30/2015  . Hyperlipidemia 03/09/2015  . Hypertension   . Hypertensive heart disease 03/09/2015  . Insomnia 07/11/2015  . Medicare annual wellness visit, subsequent 03/29/2016  . MI (myocardial infarction) ([place])   . Moderate episode of recurrent major depressive disorder ([place]) 03/29/2016  . Personal history of cerebral atherosclerosis 06/25/2017  . Small bowel obstruction ([place]) 03/30/2015  . 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  . Ventricular premature depolarization 03/09/2015    Past Surgical History:  Procedure Laterality Date  . BACK SURGERY    . BASAL CELL CARCINOMA EXCISION    . CARDIAC CATHETERIZATION    . CARDIC CATHERERIZATION    . CERVICAL SPINE SURGERY    . HERNIA REPAIR    . I & D EXTREMITY Right 07/31/2013   Procedure: Exploration and Repair Complex Laceration Right Index Finger;  Surgeon: Schuyler Amor, MD;  Location: [place];  Service: Orthopedics;  Laterality: Right;  . KIDNEY STONE SURGERY    . KNEE SUGERY     . PR EXPLORATORY OF ABDOMEN   2016  . SHOULDER SURGERY    . SMALL INTESTINE SURGERY      Current Medications: No outpatient  medications have been marked as taking for the 05/05/20 encounter (Appointment) with Richardo Priest, MD.     Allergies:   Donepezil, Ivp dye [iodinated diagnostic agents], and Sulfa antibiotics   Social History   Socioeconomic History  . Marital status: Married    Spouse name: Not on file  . Number of children: Not on file  . Years of education: Not on file  . Highest education level: Not on file  Occupational History  . Not on  file  Tobacco Use  . Smoking status: Never Smoker  . Smokeless tobacco: Never Used  Vaping Use  . Vaping Use: Never used  Substance and Sexual Activity  . Alcohol use: No  . Drug use: No  . Sexual activity: Not on file  Other Topics Concern  . Not on file  Social History Narrative  . Not on file   Social Determinants of Health   Financial Resource Strain:   . Difficulty of Paying Living Expenses: Not on file  Food Insecurity:   . Worried About Charity fundraiser in the Last Year: Not on file  . Ran Out of Food in the Last Year: Not on file  Transportation Needs:   . Lack of Transportation (Medical): Not on file  . Lack of Transportation (Non-Medical): Not on file  Physical Activity:   . Days of Exercise per Week: Not on file  . Minutes of Exercise per Session: Not on file  Stress:   . Feeling of Stress : Not on file  Social Connections:   . Frequency of Communication with Friends and Family: Not on file  . Frequency of Social Gatherings with Friends and Family: Not on file  . Attends Religious Services: Not on file  . Active Member of Clubs or Organizations: Not on file  . Attends Archivist Meetings: Not on file  . Marital Status: Not on file     Family History: The patient's ***family history includes Breast cancer in his mother; Colon cancer in his brother. ROS:   Please see the history of present illness.    All other systems reviewed and are negative.  EKGs/Labs/Other Studies Reviewed:    The following studies were reviewed today:  EKG:  EKG ordered today and personally reviewed.  The ekg ordered today demonstrates ***  Recent Labs: 10/20/2019: ALT 17; BUN 24; Creatinine, Ser 1.45; NT-Pro BNP 245; Potassium 4.4; Sodium 139  Recent Lipid Panel    Component Value Date/Time   CHOL 176 10/20/2019 1143   TRIG 302 (H) 10/20/2019 1143   HDL 32 (L) 10/20/2019 1143   CHOLHDL 5.5 (H) 10/20/2019 1143   LDLCALC 93 10/20/2019 1143    Physical Exam:     VS:  There were no vitals taken for this visit.    Wt Readings from Last 3 Encounters:  10/20/19 187 lb (84.8 kg)  04/23/19 184 lb 3.2 oz (83.6 kg)  01/16/19 181 lb 3.2 oz (82.2 kg)     GEN: *** Well nourished, well developed in no acute distress HEENT: Normal NECK: No JVD; No carotid bruits LYMPHATICS: No lymphadenopathy CARDIAC: ***RRR, no murmurs, rubs, gallops RESPIRATORY:  Clear to auscultation without rales, wheezing or rhonchi  ABDOMEN: Soft, non-tender, non-distended MUSCULOSKELETAL:  No edema; No deformity  SKIN: Warm and dry NEUROLOGIC:  Alert and oriented x 3 PSYCHIATRIC:  Normal affect    Signed, Shirlee More, MD  05/04/2020 4:39 PM    [place]

## 2020-05-05 ENCOUNTER — Ambulatory Visit (INDEPENDENT_AMBULATORY_CARE_PROVIDER_SITE_OTHER): Payer: Medicare Other | Admitting: Cardiology

## 2020-05-05 ENCOUNTER — Encounter: Payer: Self-pay | Admitting: Cardiology

## 2020-05-05 ENCOUNTER — Ambulatory Visit: Payer: Medicare Other | Admitting: Cardiology

## 2020-05-05 ENCOUNTER — Other Ambulatory Visit: Payer: Self-pay

## 2020-05-05 VITALS — BP 110/62 | HR 77 | Ht 70.0 in | Wt 180.2 lb

## 2020-05-05 DIAGNOSIS — I420 Dilated cardiomyopathy: Secondary | ICD-10-CM

## 2020-05-05 DIAGNOSIS — I2510 Atherosclerotic heart disease of native coronary artery without angina pectoris: Secondary | ICD-10-CM | POA: Diagnosis not present

## 2020-05-05 DIAGNOSIS — I493 Ventricular premature depolarization: Secondary | ICD-10-CM

## 2020-05-05 DIAGNOSIS — I110 Hypertensive heart disease with heart failure: Secondary | ICD-10-CM | POA: Diagnosis not present

## 2020-05-05 DIAGNOSIS — N182 Chronic kidney disease, stage 2 (mild): Secondary | ICD-10-CM

## 2020-05-05 DIAGNOSIS — I5022 Chronic systolic (congestive) heart failure: Secondary | ICD-10-CM

## 2020-05-05 DIAGNOSIS — E785 Hyperlipidemia, unspecified: Secondary | ICD-10-CM

## 2020-05-05 MED ORDER — FUROSEMIDE 20 MG PO TABS
20.0000 mg | ORAL_TABLET | Freq: Every day | ORAL | 3 refills | Status: DC
Start: 2020-05-05 — End: 2021-06-20

## 2020-05-05 NOTE — Progress Notes (Signed)
Cardiology Office Note:    Date:  05/05/2020   ID:  David Hester, [place] [birth year], MRN [id number]  PCP:  Rubie Maid, MD  Cardiologist:  Shirlee More, MD    Referring MD: Rubie Maid, MD    ASSESSMENT:    1. Dilated cardiomyopathy ([name])   2. Hypertensive heart disease with chronic systolic congestive heart failure ([place])   3. Mild CAD   4. Ventricular premature depolarization   5. Hyperlipidemia, unspecified hyperlipidemia type   6. CKD (chronic kidney disease), stage II    PLAN:    In order of problems listed above:  1. Mr. David Hester is done well with his cardiomyopathy and heart failure with guideline directed therapies meaningful improvement ejection fraction will continue beta-blocker maximally tolerated ACE inhibitor loop diuretic and he is not on MRA with his CKD.  Recent labs showed improved kidney function. 2. BP at target no evidence of fluid overload continue his current diuretic 3. Stable CAD medical therapy aspirin beta-blocker and lipid-lowering he is not having anginal discomfort.  New York Heart Association class I 4. Stable asymptomatic continue his beta-blocker 5. Stable lipids are good target continue statin fish oil 6. Improved on recent labs   Next appointment: 6 months   Medication Adjustments/Labs and Tests Ordered: Current medicines are reviewed at length with the patient today.  Concerns regarding medicines are outlined above.  Orders Placed This Encounter  Procedures  . EKG 12-Lead   No orders of the defined types were placed in this encounter.   Chief Complaint  Patient presents with  . Follow-up  . Congestive Heart Failure  . Cardiomyopathy    History of Present Illness:    David Hester is a 74 y.o. 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  Other problems include  mild CAD PVCs and cognitive changes.  He was last seen 10/20/2018. Compliance with diet, lifestyle and medications: Yes  His wife is present retired Marine scientist actively involved in his care and participate in evaluation decision making He remains very active and has no exercise intolerance exertional chest pain palpitation shortness of breath or edema. He has had mild indigestion. She has resumed his over-the-counter fish oil with elevated triglycerides. Blood pressure has been in range at home and he is not had obvious PVCs  Echocardiogram 11/05/2019 showed EF 35 to 40% with diffuse hypokinesia and normal diastolic filling pressures. His proBNP level remained stable and low    6 mo ago  (10/20/19) 1 yr ago  (04/23/19) 1 yr ago  (01/16/19)    NT-Pro BNP 0 - 486 pg/mL 245  260 CM  150 CM     Recent labs [facility] primary care 03/22/2020: CMP potassium 5.0 sodium 139 creatinine 1.50 GFR 45 cc normal liver function test Cholesterol 209 LDL 110 triglycerides 403 HDL 35 Hemoglobin 16.7 platelets 236,000  Past Medical History:  Diagnosis Date  . Bilateral wrist pain 07/11/2015  . CAD (coronary artery disease), native coronary artery 03/09/2015   single vessel 60 percent PDA stenosis by previous catheterization. single vessel 60 percent PDA stenosis by previous catheterization.  Formatting of this note might be different from the original. single vessel 60 percent PDA stenosis by previous catheterization. Formatting of this note might be different from the original. Overview:  single vessel 60 percent PDA stenosis by previous catheterizati  . Cognitive changes 07/11/2015  .  Dilated cardiomyopathy ([place]) 03/20/2018  . Glucosuria 08/20/2018  . High cholesterol   . 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 History of small bowel obstruction 03/30/2015  . Hyperglycemia 03/30/2015  . Hyperlipidemia 03/09/2015  . Hypertension   . Hypertensive heart disease 03/09/2015  . Insomnia 07/11/2015  . Medicare  annual wellness visit, subsequent 03/29/2016  . MI (myocardial infarction) ([place])   . Moderate episode of recurrent major depressive disorder ([place]) 03/29/2016  . Personal history of cerebral atherosclerosis 06/25/2017  . Small bowel obstruction ([place]) 03/30/2015  . 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  . Ventricular premature depolarization 03/09/2015    Past Surgical History:  Procedure Laterality Date  . BACK SURGERY    . BASAL CELL CARCINOMA EXCISION    . CARDIAC CATHETERIZATION    . CARDIC CATHERERIZATION    . CERVICAL SPINE SURGERY    . HERNIA REPAIR    . I & D EXTREMITY Right 07/31/2013   Procedure: Exploration and Repair Complex Laceration Right Index Finger;  Surgeon: Schuyler Amor, MD;  Location: [place];  Service: Orthopedics;  Laterality: Right;  . KIDNEY STONE SURGERY    . KNEE SUGERY     . PR EXPLORATORY OF ABDOMEN   2016  . SHOULDER SURGERY    . SMALL INTESTINE SURGERY      Current Medications: Current Meds  Medication Sig  . acetaminophen (TYLENOL 8 HOUR ARTHRITIS PAIN) 650 MG CR tablet Take 650 mg by mouth every 8 (eight) hours as needed for pain.  Marland Kitchen ammonium lactate (LAC-HYDRIN) 12 % lotion Apply topically.  Marland Kitchen aspirin 81 MG tablet Take 81 mg by mouth daily.  Marland Kitchen b complex vitamins tablet Take 1 tablet by mouth daily.  . carvedilol (COREG) 6.25 MG tablet TAKE 1/2 TABLET BY MOUTH TWICE A DAY  . cetirizine (ZYRTEC) 10 MG tablet Take 10 mg by mouth daily as needed.  . chlorhexidine (PERIDEX) 0.12 % solution SMARTSIG:By Mouth  . Cholecalciferol (VITAMIN D3) 1.25 MG (50000 UT) CAPS   . Cyanocobalamin (VITAMIN B-12) 5000 MCG SUBL Place 1 tablet under the tongue daily.  Marland Kitchen ENTRESTO 97-103 MG TAKE 1 TABLET BY MOUTH TWICE A DAY  . escitalopram (LEXAPRO) 10 MG tablet Take 10 mg by mouth daily.  . furosemide (LASIX) 20 MG tablet TAKE 1 TABLET (20 MG TOTAL) BY MOUTH DAILY. TAKE ADDITIONAL 20 MG IF WEIGHT GREATER THAN 182LB  . HYDROcodone-acetaminophen (NORCO/VICODIN) 5-325 MG  tablet Take by mouth.  Marland Kitchen ibuprofen (ADVIL,MOTRIN) 200 MG tablet Take 200 mg by mouth every 6 (six) hours as needed.  . lovastatin (MEVACOR) 10 MG tablet Take 10 mg by mouth daily.   . MULTIPLE VITAMIN PO Take 1 tablet by mouth daily.   . nitroGLYCERIN (NITROSTAT) 0.4 MG SL tablet Place 1 tablet (0.4 mg total) under the tongue every 5 (five) minutes as needed for chest pain.  . Omega-3 Fatty Acids (FISH OIL) 1000 MG CAPS Take 1 capsule (1,000 mg total) by mouth daily.  . sildenafil (REVATIO) 20 MG tablet Take 20 mg by mouth daily as needed. 1 - 5 tablets as needed  . Testosterone Cypionate 200 MG/ML SOLN   . traMADol (ULTRAM) 50 MG tablet Take 50 mg by mouth every 8 (eight) hours as needed. for pain  . Wheat Dextrin (BENEFIBER DRINK MIX PO) Take 1 packet by mouth 2 (two) times daily.   Marland Kitchen zolpidem (AMBIEN) 10 MG tablet Take 10 mg by mouth at bedtime as needed for sleep.      Allergies:  Donepezil, Ivp dye [iodinated diagnostic agents], and Sulfa antibiotics   Social History   Socioeconomic History  . Marital status: Married    Spouse name: Not on file  . Number of children: Not on file  . Years of education: Not on file  . Highest education level: Not on file  Occupational History  . Not on file  Tobacco Use  . Smoking status: Never Smoker  . Smokeless tobacco: Never Used  Vaping Use  . Vaping Use: Never used  Substance and Sexual Activity  . Alcohol use: No  . Drug use: No  . Sexual activity: Not on file  Other Topics Concern  . Not on file  Social History Narrative  . Not on file   Social Determinants of Health   Financial Resource Strain:   . Difficulty of Paying Living Expenses: Not on file  Food Insecurity:   . Worried About Charity fundraiser in the Last Year: Not on file  . Ran Out of Food in the Last Year: Not on file  Transportation Needs:   . Lack of Transportation (Medical): Not on file  . Lack of Transportation (Non-Medical): Not on file  Physical  Activity:   . Days of Exercise per Week: Not on file  . Minutes of Exercise per Session: Not on file  Stress:   . Feeling of Stress : Not on file  Social Connections:   . Frequency of Communication with Friends and Family: Not on file  . Frequency of Social Gatherings with Friends and Family: Not on file  . Attends Religious Services: Not on file  . Active Member of Clubs or Organizations: Not on file  . Attends Archivist Meetings: Not on file  . Marital Status: Not on file     Family History: The patient's family history includes Breast cancer in his mother; Colon cancer in his brother. ROS:   Please see the history of present illness.    All other systems reviewed and are negative.  EKGs/Labs/Other Studies Reviewed:    The following studies were reviewed today:  EKG:  EKG ordered today and personally reviewed.  The ekg ordered today demonstrates sinus rhythm old inferior MI  Recent Labs: 10/20/2019: ALT 17; BUN 24; Creatinine, Ser 1.45; NT-Pro BNP 245; Potassium 4.4; Sodium 139  Recent Lipid Panel    Component Value Date/Time   CHOL 176 10/20/2019 1143   TRIG 302 (H) 10/20/2019 1143   HDL 32 (L) 10/20/2019 1143   CHOLHDL 5.5 (H) 10/20/2019 1143   LDLCALC 93 10/20/2019 1143    Physical Exam:    VS:  BP 110/62   Pulse 77   Ht 5\' 10"  (1.778 m)   Wt 180 lb 3.2 oz (81.7 kg)   SpO2 94%   BMI 25.86 kg/m     Wt Readings from Last 3 Encounters:  05/05/20 180 lb 3.2 oz (81.7 kg)  10/20/19 187 lb (84.8 kg)  04/23/19 184 lb 3.2 oz (83.6 kg)     GEN:  Well nourished, well developed in no acute distress HEENT: Normal NECK: No JVD; No carotid bruits LYMPHATICS: No lymphadenopathy CARDIAC: RRR, no murmurs, rubs, gallops RESPIRATORY:  Clear to auscultation without rales, wheezing or rhonchi  ABDOMEN: Soft, non-tender, non-distended MUSCULOSKELETAL:  No edema; No deformity  SKIN: Warm and dry NEUROLOGIC:  Alert and oriented x 3 PSYCHIATRIC:  Normal affect     Signed, Shirlee More, MD  05/05/2020 1:57 PM    [facility]

## 2020-05-05 NOTE — Patient Instructions (Signed)

## 2020-07-10 ENCOUNTER — Other Ambulatory Visit: Payer: Self-pay | Admitting: Cardiology

## 2020-07-11 NOTE — Telephone Encounter (Signed)
Refill sent to pharmacy.   

## 2020-08-22 ENCOUNTER — Other Ambulatory Visit: Payer: Self-pay | Admitting: Cardiology

## 2020-09-27 DIAGNOSIS — N1831 Chronic kidney disease, stage 3a: Secondary | ICD-10-CM

## 2020-10-19 DIAGNOSIS — I219 Acute myocardial infarction, unspecified: Secondary | ICD-10-CM | POA: Insufficient documentation

## 2020-10-19 DIAGNOSIS — I10 Essential (primary) hypertension: Secondary | ICD-10-CM | POA: Insufficient documentation

## 2020-10-19 DIAGNOSIS — E7800 Pure hypercholesterolemia, unspecified: Secondary | ICD-10-CM | POA: Insufficient documentation

## 2020-11-03 ENCOUNTER — Ambulatory Visit: Payer: Medicare Other | Admitting: Cardiology

## 2020-11-04 ENCOUNTER — Ambulatory Visit (INDEPENDENT_AMBULATORY_CARE_PROVIDER_SITE_OTHER): Payer: Medicare Other | Admitting: Cardiology

## 2020-11-04 ENCOUNTER — Encounter: Payer: Self-pay | Admitting: Cardiology

## 2020-11-04 ENCOUNTER — Other Ambulatory Visit: Payer: Self-pay

## 2020-11-04 VITALS — BP 94/70 | HR 78 | Ht 70.0 in | Wt 178.0 lb

## 2020-11-04 DIAGNOSIS — I420 Dilated cardiomyopathy: Secondary | ICD-10-CM

## 2020-11-04 DIAGNOSIS — I2510 Atherosclerotic heart disease of native coronary artery without angina pectoris: Secondary | ICD-10-CM

## 2020-11-04 DIAGNOSIS — I110 Hypertensive heart disease with heart failure: Secondary | ICD-10-CM

## 2020-11-04 DIAGNOSIS — N182 Chronic kidney disease, stage 2 (mild): Secondary | ICD-10-CM

## 2020-11-04 DIAGNOSIS — I5022 Chronic systolic (congestive) heart failure: Secondary | ICD-10-CM

## 2020-11-04 DIAGNOSIS — E785 Hyperlipidemia, unspecified: Secondary | ICD-10-CM

## 2020-11-04 NOTE — Progress Notes (Signed)
Cardiology Office Note:    Date:  11/04/2020   ID:  David Hester, [place] May 10, 1946, MRN [id number]  PCP:  Rubie Maid, MD  Cardiologist:  Shirlee More, MD    Referring MD: Rubie Maid, MD    ASSESSMENT:    1. Dilated cardiomyopathy (David Hester)   2. Hypertensive heart disease with chronic systolic congestive heart failure (David Hester)   3. Mild CAD   4. CKD (chronic kidney disease), stage II   5. Hyperlipidemia, unspecified hyperlipidemia type    PLAN:    In order of problems listed above:  1. Per my perspective he continues to do well he is asymptomatic with this cardiomyopathy and continues to take a low-dose loop diuretic normal potassium preserved stable renal function with CKD and tolerates his cardiac medication including low-dose carvedilol along with Entresto.  Unlikely to consider adding SGLT2 inhibitor with low blood pressure today his wife will trend the numbers along and contact me if she is getting hypotension.  He will continue his current guideline directed treatment 2. He is asymptomatic from CAD New York Heart Association class I continue aspirin along with his low intensity statin well-tolerated without muscle symptoms 3. Stable CKD 4. Stable hyperlipidemia endocrine intensity therapy as he is poorly tolerant of high potency statins   Next appointment: 6 months   Medication Adjustments/Labs and Tests Ordered: Current medicines are reviewed at length with the patient today.  Concerns regarding medicines are outlined above.  No orders of the defined types were placed in this encounter.  No orders of the defined types were placed in this encounter.   Chief Complaint  Patient presents with  . Follow-up    History of Present Illness:    David Hester is a 75 y.o. 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  He was last seen 05/05/2020.  Echocardiogram 11/05/2019  showed EF 30 to 40% with diffuse hypokinesia.  And on guideline directed therapy including beta-blocker carvedilol and Entresto and low-dose loop diuretic.  Compliance with diet, lifestyle and medications: Yes  His wife is an Therapist, sports she manages heart rate and blood pressure at home but he is no episodes of bradycardia.  Cardiovascular perspective is doing well does not have palpitations syncope edema chest pain or shortness of breath and tolerates his guideline directed therapy.   Recent labs 09/27/2020: Cholesterol 203 triglycerides 41 HDL 33 LDL 100 Potassium 4.9 creatinine 1.57 stable GFR 45 cc Hemoglobin 16.7 platelets 220,000 Past Medical History:  Diagnosis Date  . Bilateral wrist pain 07/11/2015  . CAD (coronary artery disease), native coronary artery 03/09/2015   single vessel 60 percent PDA stenosis by previous catheterization. single vessel 60 percent PDA stenosis by previous catheterization.  Formatting of this note might be different from the original. single vessel 60 percent PDA stenosis by previous catheterization. Formatting of this note might be different from the original. Overview:  single vessel 60 percent PDA stenosis by previous catheterizati  . Cognitive changes 07/11/2015  . Dilated cardiomyopathy (David Hester) 03/20/2018  . Glucosuria 08/20/2018  . High cholesterol   . 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 History of small bowel obstruction 03/30/2015  . Hyperglycemia 03/30/2015  . Hyperlipidemia 03/09/2015  . Hypertension   . Hypertensive heart disease 03/09/2015  . Insomnia 07/11/2015  . Medicare annual wellness visit, subsequent 03/29/2016  . MI (myocardial infarction) ([place])   . Moderate episode of recurrent major depressive disorder ([place]) 03/29/2016  . Personal  history of cerebral atherosclerosis 06/25/2017  . Small bowel obstruction ([place]) 03/30/2015  . 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  . Ventricular premature depolarization 03/09/2015    Past Surgical History:  Procedure  Laterality Date  . BACK SURGERY    . BASAL CELL CARCINOMA EXCISION    . CARDIAC CATHETERIZATION    . CARDIC CATHERERIZATION    . CERVICAL SPINE SURGERY    . HERNIA REPAIR    . I & D EXTREMITY Right 07/31/2013   Procedure: Exploration and Repair Complex Laceration Right Index Finger;  Surgeon: Schuyler Amor, MD;  Location: [place];  Service: Orthopedics;  Laterality: Right;  . KIDNEY STONE SURGERY    . KNEE SUGERY     . PR EXPLORATORY OF ABDOMEN   2016  . SHOULDER SURGERY    . SMALL INTESTINE SURGERY      Current Medications: Current Meds  Medication Sig  . acetaminophen (TYLENOL) 650 MG CR tablet Take 650 mg by mouth every 8 (eight) hours as needed for pain.  Marland Kitchen ammonium lactate (LAC-HYDRIN) 12 % lotion Apply topically.  Marland Kitchen aspirin 81 MG tablet Take 81 mg by mouth daily.  Marland Kitchen b complex vitamins tablet Take 1 tablet by mouth daily.  . carvedilol (COREG) 6.25 MG tablet TAKE 1/2 TABLET BY MOUTH TWICE A DAY  . cetirizine (ZYRTEC) 10 MG tablet Take 10 mg by mouth daily as needed.  . Cholecalciferol (VITAMIN D3) 125 MCG (5000 UT) CAPS Take 5,000 Units by mouth daily.  . Cyanocobalamin (VITAMIN B-12) 5000 MCG SUBL Place 1 tablet under the tongue daily.  Marland Kitchen ENTRESTO 97-103 MG TAKE 1 TABLET BY MOUTH TWICE A DAY  . escitalopram (LEXAPRO) 10 MG tablet Take 10 mg by mouth daily.  . furosemide (LASIX) 20 MG tablet Take 1 tablet (20 mg total) by mouth daily. Take additional 20 mg if weight greater than 182lb  . ibuprofen (ADVIL,MOTRIN) 200 MG tablet Take 200 mg by mouth every 6 (six) hours as needed.  . lovastatin (MEVACOR) 10 MG tablet Take 10 mg by mouth daily.   . MULTIPLE VITAMIN PO Take 1 tablet by mouth daily.   . nitroGLYCERIN (NITROSTAT) 0.4 MG SL tablet Place 1 tablet (0.4 mg total) under the tongue every 5 (five) minutes as needed for chest pain.  . Omega-3 Fatty Acids (FISH OIL) 1000 MG CAPS Take 1 capsule (1,000 mg total) by mouth daily.  . sildenafil (REVATIO) 20 MG tablet Take 20 mg  by mouth daily as needed. 1 - 5 tablets as needed  . Testosterone Cypionate 200 MG/ML SOLN   . traMADol (ULTRAM) 50 MG tablet Take 50 mg by mouth every 8 (eight) hours as needed. for pain  . Wheat Dextrin (BENEFIBER DRINK MIX PO) Take 1 packet by mouth 2 (two) times daily.   Marland Kitchen zolpidem (AMBIEN) 10 MG tablet Take 10 mg by mouth at bedtime as needed for sleep.      Allergies:   Donepezil, Ivp dye [iodinated diagnostic agents], and Sulfa antibiotics   Social History   Socioeconomic History  . Marital status: Married    Spouse name: Not on file  . Number of children: Not on file  . Years of education: Not on file  . Highest education level: Not on file  Occupational History  . Not on file  Tobacco Use  . Smoking status: Never Smoker  . Smokeless tobacco: Never Used  Vaping Use  . Vaping Use: Never used  Substance and Sexual Activity  . Alcohol use: No  .  Drug use: No  . Sexual activity: Not on file  Other Topics Concern  . Not on file  Social History Narrative  . Not on file   Social Determinants of Health   Financial Resource Strain: Not on file  Food Insecurity: Not on file  Transportation Needs: Not on file  Physical Activity: Not on file  Stress: Not on file  Social Connections: Not on file     Family History: The patient's family history includes Breast cancer in his mother; Colon cancer in his brother. ROS:   Please see the history of present illness.    All other systems reviewed and are negative.  EKGs/Labs/Other Studies Reviewed:    The following studies were reviewed today:   Recent Labs: No results found for requested labs within last 8760 hours.  Recent Lipid Panel    Component Value Date/Time   CHOL 176 10/20/2019 1143   TRIG 302 (H) 10/20/2019 1143   HDL 32 (L) 10/20/2019 1143   CHOLHDL 5.5 (H) 10/20/2019 1143   LDLCALC 93 10/20/2019 1143    Physical Exam:    VS:  BP 94/70 (BP Location: Left Arm, Patient Position: Sitting, Cuff Size:  Normal)   Pulse 78   Ht 5\' 10"  (1.778 m)   Wt 178 lb (80.7 kg)   SpO2 92%   BMI 25.54 kg/m     Wt Readings from Last 3 Encounters:  11/04/20 178 lb (80.7 kg)  05/05/20 180 lb 3.2 oz (81.7 kg)  10/20/19 187 lb (84.8 kg)     GEN:  Well nourished, well developed in no acute distress HEENT: Normal NECK: No JVD; No carotid bruits LYMPHATICS: No lymphadenopathy CARDIAC: RRR, no murmurs, rubs, gallops RESPIRATORY:  Clear to auscultation without rales, wheezing or rhonchi  ABDOMEN: Soft, non-tender, non-distended MUSCULOSKELETAL:  No edema; No deformity  SKIN: Warm and dry NEUROLOGIC:  Alert and oriented x 3 PSYCHIATRIC:  Normal affect    Signed, Shirlee More, MD  11/04/2020 2:03 PM    [place]

## 2020-11-04 NOTE — Patient Instructions (Signed)

## 2020-12-13 DIAGNOSIS — M65331 Trigger finger, right middle finger: Secondary | ICD-10-CM | POA: Insufficient documentation

## 2020-12-13 DIAGNOSIS — M1811 Unilateral primary osteoarthritis of first carpometacarpal joint, right hand: Secondary | ICD-10-CM

## 2020-12-22 ENCOUNTER — Other Ambulatory Visit: Payer: Self-pay | Admitting: Cardiology

## 2021-01-03 DIAGNOSIS — I252 Old myocardial infarction: Secondary | ICD-10-CM | POA: Insufficient documentation

## 2021-03-27 DIAGNOSIS — M48062 Spinal stenosis, lumbar region with neurogenic claudication: Secondary | ICD-10-CM | POA: Insufficient documentation

## 2021-03-30 DIAGNOSIS — M654 Radial styloid tenosynovitis [de Quervain]: Secondary | ICD-10-CM | POA: Insufficient documentation

## 2021-04-13 ENCOUNTER — Other Ambulatory Visit: Payer: Self-pay

## 2021-04-13 MED ORDER — CARVEDILOL 6.25 MG PO TABS: 3.1250 mg | ORAL_TABLET | Freq: Two times a day (BID) | ORAL | 1 refills | Status: DC

## 2021-05-03 ENCOUNTER — Encounter: Payer: Self-pay | Admitting: Cardiology

## 2021-05-03 ENCOUNTER — Other Ambulatory Visit: Payer: Self-pay

## 2021-05-03 ENCOUNTER — Ambulatory Visit (INDEPENDENT_AMBULATORY_CARE_PROVIDER_SITE_OTHER): Payer: Medicare Other | Admitting: Cardiology

## 2021-05-03 VITALS — BP 130/80 | HR 86 | Ht 70.0 in | Wt 181.0 lb

## 2021-05-03 DIAGNOSIS — E785 Hyperlipidemia, unspecified: Secondary | ICD-10-CM

## 2021-05-03 DIAGNOSIS — I2510 Atherosclerotic heart disease of native coronary artery without angina pectoris: Secondary | ICD-10-CM | POA: Diagnosis not present

## 2021-05-03 DIAGNOSIS — I420 Dilated cardiomyopathy: Secondary | ICD-10-CM

## 2021-05-03 DIAGNOSIS — I110 Hypertensive heart disease with heart failure: Secondary | ICD-10-CM

## 2021-05-03 DIAGNOSIS — N182 Chronic kidney disease, stage 2 (mild): Secondary | ICD-10-CM | POA: Diagnosis not present

## 2021-05-03 DIAGNOSIS — I5022 Chronic systolic (congestive) heart failure: Secondary | ICD-10-CM

## 2021-05-03 MED ORDER — PREDNISONE 50 MG PO TABS: ORAL_TABLET | ORAL | 0 refills | Status: DC

## 2021-05-03 NOTE — Patient Instructions (Signed)
Medication Instructions:  Your physician recommends that you continue on your current medications as directed. Please refer to the Current Medication list given to you today.  *If you need a refill on your cardiac medications before your next appointment, please call your pharmacy*   Lab Work: Your physician recommends that you return for lab work today: bmp, troponin  If you have labs (blood work) drawn today and your tests are completely normal, you will receive your results only by: [place] (if you have MyChart) OR A paper copy in the mail If you have any lab test that is abnormal or we need to change your treatment, we will call you to review the results.   Testing/Procedures:   Your cardiac CT will be scheduled at one of the below locations:   [facility] [address] [place], [place] [address] [phone]  [facility] [address] [place], [place] [address] [phone]  If scheduled at [facility], please arrive at the [facility] main entrance (entrance A) of [facility] 30 minutes prior to test start time. You can use the FREE valet parking offered at the main entrance (encouraged to control the heart rate for the test) Proceed to the [facility] Radiology Department (first floor) to check-in and test prep.  If scheduled at [facility], please arrive 15 mins early for check-in and test prep.  Please follow these instructions carefully (unless otherwise directed):  Hold all erectile dysfunction medications at least 3 days (72 hrs) prior to test.  On the Night Before the Test: Be sure to Drink plenty of water. Do not consume any caffeinated/decaffeinated beverages or chocolate 12 hours prior to your test. Do not take any antihistamines 12 hours prior to your test. If the patient has contrast allergy: Patient will need a prescription for Prednisone  and very clear instructions (as follows): Prednisone 50 mg - take 13 hours prior to test Take another Prednisone 50 mg 7 hours prior to test Take another Prednisone 50 mg 1 hour prior to test Take Benadryl 50 mg 1 hour prior to test Patient must complete all four doses of above prophylactic medications. Patient will need a ride after test due to Benadryl.  On the Day of the Test: Drink plenty of water until 1 hour prior to the test. Do not eat any food 4 hours prior to the test. You may take your regular medications prior to the test.  Take carvedilol Lopressor) two hours prior to test. HOLD Furosemide morning of the test.           After the Test: Drink plenty of water. After receiving IV contrast, you may experience a mild flushed feeling. This is normal. On occasion, you may experience a mild rash up to 24 hours after the test. This is not dangerous. If this occurs, you can take Benadryl 25 mg and increase your fluid intake. If you experience trouble breathing, this can be serious. If it is severe call 911 IMMEDIATELY. If it is mild, please call our office. If you take any of these medications: Glipizide/Metformin, Avandament, Glucavance, please do not take 48 hours after completing test unless otherwise instructed.  Please allow 2-4 weeks for scheduling of routine cardiac CTs. Some insurance companies require a pre-authorization which may delay scheduling of this test.   For non-scheduling related questions, please contact the cardiac imaging nurse navigator should you have any questions/concerns: Marchia Bond, Cardiac  Imaging Nurse Navigator Gordy Clement, Cardiac Imaging Nurse Navigator [facility] Direct Office Dial: [phone]   For scheduling needs, including cancellations and rescheduling, please call [place], [phone].    Follow-Up: At [facility], you and your health needs are our priority.  As part of our continuing mission to  provide you with exceptional heart care, we have created designated Provider Care Teams.  These Care Teams include your primary Cardiologist (physician) and Advanced Practice Providers (APPs -  Physician Assistants and Nurse Practitioners) who all work together to provide you with the care you need, when you need it.  We recommend signing up for the patient portal called "MyChart".  Sign up information is provided on this After Visit Summary.  MyChart is used to connect with patients for Virtual Visits (Telemedicine).  Patients are able to view lab/test results, encounter notes, upcoming appointments, etc.  Non-urgent messages can be sent to your provider as well.   To learn more about what you can do with MyChart, go to [email or web address].    Your next appointment:   6 week(s)  The format for your next appointment:   In Person  Provider:   Jenne Campus, MD    Other Instructions  Cardiac CT Angiogram A cardiac CT angiogram is a procedure to look at the heart and the area around the heart. It may be done to help find the cause of chest pains or other symptoms of heart disease. During this procedure, a substance called contrast dye is injected into the blood vessels in the area to be checked. A large X-ray machine, called a CT scanner, then takes detailed pictures of the heart and the surrounding area. The procedure is also sometimes called a coronary CT angiogram, coronary artery scanning, or CTA. A cardiac CT angiogram allows the health care provider to see how well blood is flowing to and from the heart. The health care provider will be able to see if there are any problems, such as: Blockage or narrowing of the coronary arteries in the heart. Fluid around the heart. Signs of weakness or disease in the muscles, valves, and tissues of the heart. Tell a health care provider about: Any allergies you have. This is especially important if you have had a previous allergic reaction to  contrast dye. All medicines you are taking, including vitamins, herbs, eye drops, creams, and over-the-counter medicines. Any blood disorders you have. Any surgeries you have had. Any medical conditions you have. Whether you are pregnant or may be pregnant. Any anxiety disorders, chronic pain, or other conditions you have that may increase your stress or prevent you from lying still. What are the risks? Generally, this is a safe procedure. However, problems may occur, including: Bleeding. Infection. Allergic reactions to medicines or dyes. Damage to other structures or organs. Kidney damage from the contrast dye that is used. Increased risk of cancer from radiation exposure. This risk is low. Talk with your health care provider about: The risks and benefits of testing. How you can receive the lowest dose of radiation. What happens before the procedure? Wear comfortable clothing and remove any jewelry, glasses, dentures, and hearing aids. Follow instructions from your health care provider about eating and drinking. This may include: For 12 hours before the procedure -- avoid caffeine. This includes tea, coffee, soda, energy drinks, and diet pills. Drink plenty of water or other fluids that do not have caffeine in them. Being well hydrated can prevent complications. For 4-6 hours before  the procedure -- stop eating and drinking. The contrast dye can cause nausea, but this is less likely if your stomach is empty. Ask your health care provider about changing or stopping your regular medicines. This is especially important if you are taking diabetes medicines, blood thinners, or medicines to treat problems with erections (erectile dysfunction). What happens during the procedure?  Hair on your chest may need to be removed so that small sticky patches called electrodes can be placed on your chest. These will transmit information that helps to monitor your heart during the procedure. An IV will be  inserted into one of your veins. You might be given a medicine to control your heart rate during the procedure. This will help to ensure that good images are obtained. You will be asked to lie on an exam table. This table will slide in and out of the CT machine during the procedure. Contrast dye will be injected into the IV. You might feel warm, or you may get a metallic taste in your mouth. You will be given a medicine called nitroglycerin. This will relax or dilate the arteries in your heart. The table that you are lying on will move into the CT machine tunnel for the scan. The person running the machine will give you instructions while the scans are being done. You may be asked to: Keep your arms above your head. Hold your breath. Stay very still, even if the table is moving. When the scanning is complete, you will be moved out of the machine. The IV will be removed. The procedure may vary among health care providers and hospitals. What can I expect after the procedure? After your procedure, it is common to have: A metallic taste in your mouth from the contrast dye. A feeling of warmth. A headache from the nitroglycerin. Follow these instructions at home: Take over-the-counter and prescription medicines only as told by your health care provider. If you are told, drink enough fluid to keep your urine pale yellow. This will help to flush the contrast dye out of your body. Most people can return to their normal activities right after the procedure. Ask your health care provider what activities are safe for you. It is up to you to get the results of your procedure. Ask your health care provider, or the department that is doing the procedure, when your results will be ready. Keep all follow-up visits as told by your health care provider. This is important. Contact a health care provider if: You have any symptoms of allergy to the contrast dye. These include: Shortness of breath. Rash or  hives. A racing heartbeat. Summary A cardiac CT angiogram is a procedure to look at the heart and the area around the heart. It may be done to help find the cause of chest pains or other symptoms of heart disease. During this procedure, a large X-ray machine, called a CT scanner, takes detailed pictures of the heart and the surrounding area after a contrast dye has been injected into blood vessels in the area. Ask your health care provider about changing or stopping your regular medicines before the procedure. This is especially important if you are taking diabetes medicines, blood thinners, or medicines to treat erectile dysfunction. If you are told, drink enough fluid to keep your urine pale yellow. This will help to flush the contrast dye out of your body. This information is not intended to replace advice given to you by your health care provider. Make sure you  discuss any questions you have with your health care provider. Document Revised: 02/15/2021 Document Reviewed: 01/28/2019 Elsevier Patient Education  2022 [employer].

## 2021-05-03 NOTE — Progress Notes (Signed)
Cardiology Office Note:    Date:  05/03/2021   ID:  David Hester, [place] 11/06/1945, MRN [id number]  PCP:  Dineen Kid, MD  Cardiologist:  Shirlee More, MD    Referring MD: Rubie Maid, MD    ASSESSMENT:    1. Mild CAD   2. Dilated cardiomyopathy ([place])   3. Hypertensive heart disease with chronic systolic congestive heart failure ([place])   4. CKD (chronic kidney disease), stage II   5. Hyperlipidemia, unspecified hyperlipidemia type    PLAN:    In order of problems listed above:  He has had an  abrupt change in his pattern raising concern for progressive CAD, despite his dementia he has a good quality of life he is on good medical therapy in order to go ahead and do a cardiac CTA with a dye prep he is allergic to contrast dye and will require IV fluid loading depending on his GFR on repeat today.  If severe obstructive CAD and a candidate for PCI I think would improve the quality of his life.  Continue his current medical therapy including aspirin statin beta-blocker.  Next episode his wife will capture an EKG from her apple watch. Cardiomyopathy and heart failure stable continue medical therapy including high-dose Entresto beta-blocker loop diuretic continue statin recheck renal function for CKD. Also do high-sensitivity troponin if elevated I think he do best with coronary angiography as opposed to CTA    Next appointment: 6 weeks   Medication Adjustments/Labs and Tests Ordered: Current medicines are reviewed at length with the patient today.  Concerns regarding medicines are outlined above.  No orders of the defined types were placed in this encounter.  No orders of the defined types were placed in this encounter.   Chief Complaint  Patient presents with   Follow-up   Coronary Artery Disease   Cardiomyopathy   Congestive Heart Failure   .[facility]arrehab  History of Present Illness:    David Hester is a 75 y.o. 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  Compliance with diet, lifestyle and medications: Yes  His wife is a critical care nurse watches him very closely recently has been having episodes of chest pain his heart rate will drop as low as 40 his blood pressure dropped to 90 systolic he has had oxygen sats of 86-90.  She has not had to give him nitroglycerin has not given up because of the hypotension he resolves with rest and they occur with physical effort when he is working hard outdoors.  In general he does not feel as well he complains of weakness and has had some GI symptoms and diaphoresis.  Unfortunately dementia he does not remember the episodes.  His coronary angiogram was likely in the range of 15 point and he had 60% PDA stenosis at that time treated medically He is not having edema shortness of breath palpitation or syncope She has an apple watch and I have asked her He has an episode of put her watch in his wrist and record EKG Intermittently she holds carvedilol with hypotension but if she does that he severely hypotensive the next morning  Labs 09/27/2020 cholesterol 203 triglycerides 41 HDL 33 LDL 101 potassium 4.9 creatinine 1.57 GFR 45 cc Echo 11/05/2019:  1. Left ventricular ejection fraction, by estimation, is 35 to 40%. The  left ventricle has moderately decreased function. The left ventricle has  no regional  wall motion abnormalities. Left ventricular diastolic  parameters are consistent with Grade I  diastolic dysfunction (impaired relaxation).   2. Right ventricular systolic function is normal. The right ventricular  size is normal. There is normal pulmonary artery systolic pressure.   3. The mitral valve is normal in structure. Mild mitral valve  regurgitation. No evidence of mitral stenosis.   4. The aortic valve is normal in structure. Aortic valve regurgitation is  not visualized. No aortic  stenosis is present.  Past Medical History:  Diagnosis Date   Bilateral wrist pain 07/11/2015   CAD (coronary artery disease), native coronary artery 03/09/2015   single vessel 60 percent PDA stenosis by previous catheterization. single vessel 60 percent PDA stenosis by previous catheterization.  Formatting of this note might be different from the original. single vessel 60 percent PDA stenosis by previous catheterization. Formatting of this note might be different from the original. 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   History of small bowel obstruction 03/30/2015   Hyperglycemia 03/30/2015   Hyperlipidemia 03/09/2015   Hypertension    Hypertensive heart disease 03/09/2015   Insomnia 07/11/2015   Medicare annual wellness visit, subsequent 03/29/2016   MI (myocardial infarction) ([place])    Moderate episode of recurrent major depressive disorder ([place]) 03/29/2016   Personal history of cerebral atherosclerosis 06/25/2017   Small bowel obstruction ([place]) 03/30/2015   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   Ventricular premature depolarization 03/09/2015    Past Surgical History:  Procedure Laterality Date   BACK SURGERY     BASAL CELL CARCINOMA EXCISION     CARDIAC CATHETERIZATION     CARDIC CATHERERIZATION     CERVICAL SPINE SURGERY     HERNIA REPAIR     I & D EXTREMITY Right 07/31/2013   Procedure: Exploration and Repair Complex Laceration Right Index Finger;  Surgeon: Schuyler Amor, MD;  Location: [facility];  Service: Orthopedics;  Laterality: Right;   KIDNEY STONE SURGERY     KNEE SUGERY      PR EXPLORATORY OF ABDOMEN   2016   SHOULDER SURGERY     SMALL INTESTINE SURGERY      Current Medications: Current Meds  Medication Sig   acetaminophen (TYLENOL) 650 MG CR tablet Take 650 mg by mouth every 8  (eight) hours as needed for pain.   ammonium lactate (LAC-HYDRIN) 12 % lotion Apply 1 application topically as needed for dry skin.   aspirin 81 MG tablet Take 81 mg by mouth daily.   b complex vitamins tablet Take 1 tablet by mouth daily.   carvedilol (COREG) 6.25 MG tablet Take 0.5 tablets (3.125 mg total) by mouth 2 (two) times daily.   Cholecalciferol (VITAMIN D3) 125 MCG (5000 UT) CAPS Take 5,000 Units by mouth daily.   Cyanocobalamin (VITAMIN B-12) 5000 MCG SUBL Place 1 tablet under the tongue daily.   ENTRESTO 97-103 MG TAKE 1 TABLET BY MOUTH TWICE A DAY   escitalopram (LEXAPRO) 10 MG tablet Take 10 mg by mouth daily.   furosemide (LASIX) 20 MG tablet Take 1 tablet (20 mg total) by mouth daily. Take additional 20 mg if weight greater than 182lb   ibuprofen (ADVIL,MOTRIN) 200 MG tablet Take 200 mg by mouth every 6 (six) hours as needed.   lovastatin (MEVACOR) 10 MG tablet Take 10 mg by  mouth daily.    MULTIPLE VITAMIN PO Take 1 tablet by mouth daily.    nitroGLYCERIN (NITROSTAT) 0.4 MG SL tablet Place 1 tablet (0.4 mg total) under the tongue every 5 (five) minutes as needed for chest pain.   Omega-3 Fatty Acids (FISH OIL) 1000 MG CAPS Take 1 capsule (1,000 mg total) by mouth daily.   sildenafil (REVATIO) 20 MG tablet Take 20 mg by mouth daily as needed (erectile dysfunction). 1 - 5 tablets as needed   Testosterone Cypionate 200 MG/ML SOLN Inject 1.5 mLs as directed every 21 ( twenty-one) days.   traMADol (ULTRAM) 50 MG tablet Take 50 mg by mouth every 8 (eight) hours as needed. for pain   Wheat Dextrin (BENEFIBER DRINK MIX PO) Take 1 packet by mouth 2 (two) times daily.    zolpidem (AMBIEN) 10 MG tablet Take 10 mg by mouth at bedtime as needed for sleep.      Allergies:   Donepezil, Ivp dye [iodinated diagnostic agents], and Sulfa antibiotics   Social History   Socioeconomic History   Marital status: Married    Spouse name: Not on file   Number of children: Not on file   Years of  education: Not on file   Highest education level: Not on file  Occupational History   Not on file  Tobacco Use   Smoking status: Former    Types: Cigarettes   Smokeless tobacco: Never  Vaping Use   Vaping Use: Never used  Substance and Sexual Activity   Alcohol use: No   Drug use: No   Sexual activity: Not on file  Other Topics Concern   Not on file  Social History Narrative   Not on file   Social Determinants of Health   Financial Resource Strain: Not on file  Food Insecurity: Not on file  Transportation Needs: Not on file  Physical Activity: Not on file  Stress: Not on file  Social Connections: Not on file     Family History: The patient's family history includes Breast cancer in his mother; Colon cancer in his brother. ROS:   Please see the history of present illness.    All other systems reviewed and are negative.  EKGs/Labs/Other Studies Reviewed:    The following studies were reviewed today:  EKG:  EKG ordered today and personally reviewed.  The ekg ordered today demonstrates sinus rhythm 1 PVC old inferior infarction  Recent Labs: No results found for requested labs within last 8760 hours.  Recent Lipid Panel    Component Value Date/Time   CHOL 176 10/20/2019 1143   TRIG 302 (H) 10/20/2019 1143   HDL 32 (L) 10/20/2019 1143   CHOLHDL 5.5 (H) 10/20/2019 1143   LDLCALC 93 10/20/2019 1143    Physical Exam:    VS:  BP 130/80 (BP Location: Left Arm, Patient Position: Sitting, Cuff Size: Normal)   Pulse 86   Ht 5\' 10"  (1.778 m)   Wt 181 lb (82.1 kg)   SpO2 95%   BMI 25.97 kg/m     Wt Readings from Last 3 Encounters:  05/03/21 181 lb (82.1 kg)  11/04/20 178 lb (80.7 kg)  05/05/20 180 lb 3.2 oz (81.7 kg)     GEN:  Well nourished, well developed in no acute distress HEENT: Normal NECK: No JVD; No carotid bruits LYMPHATICS: No lymphadenopathy CARDIAC: RRR, no murmurs, rubs, gallops RESPIRATORY:  Clear to auscultation without rales, wheezing or  rhonchi  ABDOMEN: Soft, non-tender, non-distended MUSCULOSKELETAL:  No edema; No  deformity  SKIN: Warm and dry NEUROLOGIC:  Alert and oriented x 3 PSYCHIATRIC:  Normal affect    Signed, Shirlee More, MD  05/03/2021 11:39 AM    [place]

## 2021-05-04 LAB — BASIC METABOLIC PANEL
BUN/Creatinine Ratio: 14 (ref 10–24)
BUN: 18 mg/dL (ref 8–27)
CO2: 21 mmol/L (ref 20–29)
Calcium: 9.5 mg/dL (ref 8.6–10.2)
Chloride: 98 mmol/L (ref 96–106)
Creatinine, Ser: 1.29 mg/dL — ABNORMAL HIGH (ref 0.76–1.27)
Glucose: 74 mg/dL (ref 70–99)
Potassium: 5.3 mmol/L — ABNORMAL HIGH (ref 3.5–5.2)
Sodium: 136 mmol/L (ref 134–144)
eGFR: 57 mL/min/{1.73_m2} — ABNORMAL LOW (ref >59)

## 2021-05-04 LAB — TROPONIN T: Troponin T (Highly Sensitive): 18 ng/L (ref 0–22)

## 2021-05-05 ENCOUNTER — Telehealth: Payer: Self-pay

## 2021-05-05 NOTE — Telephone Encounter (Signed)
Spoke with patient regarding results and recommendation.  Patient verbalizes understanding and is agreeable to plan of care. Advised patient to call back with any issues or concerns.  

## 2021-05-05 NOTE — Telephone Encounter (Signed)
-----   Message from Richardo Priest, MD sent at 05/04/2021  5:55 PM EST ----- Stable for cardiac CTA

## 2021-05-17 ENCOUNTER — Telehealth (HOSPITAL_COMMUNITY): Payer: Self-pay | Admitting: *Deleted

## 2021-05-17 NOTE — Telephone Encounter (Signed)
Attempted to call patient regarding upcoming cardiac CT appointment. °Left message on voicemail with name and callback number ° °Caylin Raby RN Navigator Cardiac Imaging °[facility] °[phone] Office °[phone] Cell ° °

## 2021-05-17 NOTE — Telephone Encounter (Signed)
Patient's wife returning call (patient has dementia) regarding upcoming cardiac imaging study; pt's wife verbalizes understanding of appt date/time, parking situation and where to check in, pre-test NPO status and medications ordered, and verified current allergies; name and call back number provided for further questions should they arise  David Clement RN Navigator Cardiac Imaging [facility] [phone] office [phone] cell  Patient to take 6.25mg  carvedilol two hours prior to cardiac CT scan. Patient's wife verbalized understanding of how to take 13 hour prep and is aware to arrive at 11am for the patient's 11:30am scan.

## 2021-05-18 ENCOUNTER — Other Ambulatory Visit: Payer: Self-pay | Admitting: Cardiovascular Disease

## 2021-05-18 ENCOUNTER — Ambulatory Visit (HOSPITAL_COMMUNITY)
Admission: RE | Admit: 2021-05-18 | Discharge: 2021-05-18 | Disposition: A | Discharge status: Home / Self Care | Payer: Medicare Other | Source: Ambulatory Visit | Attending: Cardiology | Admitting: Cardiology

## 2021-05-18 ENCOUNTER — Ambulatory Visit (HOSPITAL_COMMUNITY)
Admission: RE | Admit: 2021-05-18 | Discharge: 2021-05-18 | Disposition: A | Discharge status: Home / Self Care | Payer: Medicare Other | Source: Ambulatory Visit | Attending: Cardiovascular Disease | Admitting: Cardiovascular Disease

## 2021-05-18 ENCOUNTER — Other Ambulatory Visit: Payer: Self-pay

## 2021-05-18 DIAGNOSIS — I420 Dilated cardiomyopathy: Secondary | ICD-10-CM | POA: Insufficient documentation

## 2021-05-18 DIAGNOSIS — I5022 Chronic systolic (congestive) heart failure: Secondary | ICD-10-CM | POA: Diagnosis present

## 2021-05-18 DIAGNOSIS — I2510 Atherosclerotic heart disease of native coronary artery without angina pectoris: Secondary | ICD-10-CM | POA: Diagnosis present

## 2021-05-18 DIAGNOSIS — R931 Abnormal findings on diagnostic imaging of heart and coronary circulation: Secondary | ICD-10-CM | POA: Insufficient documentation

## 2021-05-18 DIAGNOSIS — N182 Chronic kidney disease, stage 2 (mild): Secondary | ICD-10-CM | POA: Insufficient documentation

## 2021-05-18 DIAGNOSIS — I110 Hypertensive heart disease with heart failure: Secondary | ICD-10-CM | POA: Insufficient documentation

## 2021-05-18 DIAGNOSIS — E785 Hyperlipidemia, unspecified: Secondary | ICD-10-CM | POA: Diagnosis present

## 2021-05-18 MED ORDER — METOPROLOL TARTRATE 5 MG/5ML IV SOLN
INTRAVENOUS | Status: AC
  Administered 2021-05-18: 10 mg via INTRAVENOUS
  Filled 2021-05-18: qty 30

## 2021-05-18 MED ORDER — NITROGLYCERIN 0.4 MG SL SUBL
SUBLINGUAL_TABLET | SUBLINGUAL | Status: AC
  Filled 2021-05-18: qty 2

## 2021-05-18 MED ORDER — DILTIAZEM HCL 25 MG/5ML IV SOLN
INTRAVENOUS | Status: AC
  Administered 2021-05-18: 10 mg via INTRAVENOUS
  Filled 2021-05-18: qty 5

## 2021-05-18 MED ORDER — NITROGLYCERIN 0.4 MG SL SUBL
0.8000 mg | SUBLINGUAL_TABLET | Freq: Once | SUBLINGUAL | Status: AC
  Administered 2021-05-18: 0.8 mg via SUBLINGUAL

## 2021-05-18 MED ORDER — METOPROLOL TARTRATE 5 MG/5ML IV SOLN
10.0000 mg | INTRAVENOUS | Status: DC | PRN
  Administered 2021-05-18: 10 mg via INTRAVENOUS

## 2021-05-18 MED ORDER — DILTIAZEM HCL 25 MG/5ML IV SOLN: 10.0000 mg | INTRAVENOUS | Status: DC | PRN

## 2021-05-18 MED ORDER — IOHEXOL 350 MG/ML SOLN
95.0000 mL | Freq: Once | INTRAVENOUS | Status: AC | PRN
  Administered 2021-05-18: 95 mL via INTRAVENOUS

## 2021-05-19 ENCOUNTER — Other Ambulatory Visit: Payer: Self-pay

## 2021-05-19 MED ORDER — ROSUVASTATIN CALCIUM 10 MG PO TABS: 10.0000 mg | ORAL_TABLET | Freq: Every day | ORAL | 3 refills | Status: DC

## 2021-05-22 DIAGNOSIS — I5022 Chronic systolic (congestive) heart failure: Secondary | ICD-10-CM | POA: Insufficient documentation

## 2021-05-22 DIAGNOSIS — I2510 Atherosclerotic heart disease of native coronary artery without angina pectoris: Secondary | ICD-10-CM | POA: Insufficient documentation

## 2021-05-22 DIAGNOSIS — F05 Delirium due to known physiological condition: Secondary | ICD-10-CM

## 2021-05-24 ENCOUNTER — Encounter: Payer: Self-pay | Admitting: Cardiology

## 2021-05-25 MED ORDER — SACUBITRIL-VALSARTAN 49-51 MG PO TABS: 1.0000 | ORAL_TABLET | Freq: Two times a day (BID) | ORAL | 3 refills | Status: DC

## 2021-06-19 ENCOUNTER — Other Ambulatory Visit: Payer: Self-pay | Admitting: Cardiology

## 2021-06-20 ENCOUNTER — Other Ambulatory Visit: Payer: Self-pay | Admitting: Cardiology

## 2021-06-21 ENCOUNTER — Telehealth: Payer: Self-pay

## 2021-06-21 NOTE — Telephone Encounter (Signed)
Tried calling patient. No answer and no voicemail set up for me to leave a message. 

## 2021-06-21 NOTE — Telephone Encounter (Signed)
-----   Message from Senaida Ores, RN sent at 06/21/2021  7:49 AM EST -----  ----- Message ----- From: Richardo Priest, MD Sent: 06/20/2021   5:09 PM EST To: [facility]p Triage  He has dementia so we need to communicate through his wife in [place] she is to be an ICU nurse at [employer]  This is reassuring he does not have evidence of severe flow-limiting stenosis and I think we should continue medications.

## 2021-06-22 ENCOUNTER — Telehealth: Payer: Self-pay

## 2021-06-22 NOTE — Telephone Encounter (Signed)
Tried calling patient. No answer and no voicemail set up for me to leave a message. 

## 2021-06-22 NOTE — Telephone Encounter (Signed)
-----   Message from Senaida Ores, RN sent at 06/21/2021  7:49 AM EST -----  ----- Message ----- From: Richardo Priest, MD Sent: 06/20/2021   5:09 PM EST To: [facility]p Triage  He has dementia so we need to communicate through his wife in [place] she is to be an ICU nurse at [employer]  This is reassuring he does not have evidence of severe flow-limiting stenosis and I think we should continue medications.

## 2021-06-23 ENCOUNTER — Telehealth: Payer: Self-pay

## 2021-06-23 DIAGNOSIS — M21961 Unspecified acquired deformity of right lower leg: Secondary | ICD-10-CM

## 2021-06-23 NOTE — Telephone Encounter (Signed)
Left message on patients voicemail to please return our call.   

## 2021-06-23 NOTE — Telephone Encounter (Signed)
-----   Message from Senaida Ores, RN sent at 06/21/2021  7:49 AM EST -----  ----- Message ----- From: Richardo Priest, MD Sent: 06/20/2021   5:09 PM EST To: [facility]/Hp Triage  He has dementia so we need to communicate through his wife in [place] she is to be an ICU nurse at [employer]  This is reassuring he does not have evidence of severe flow-limiting stenosis and I think we should continue medications.

## 2021-06-26 ENCOUNTER — Telehealth: Payer: Self-pay

## 2021-06-26 NOTE — Telephone Encounter (Signed)
-----   Message from Senaida Ores, RN sent at 06/21/2021  7:49 AM EST -----  ----- Message ----- From: Richardo Priest, MD Sent: 06/20/2021   5:09 PM EST To: [facility]/Hp Triage  He has dementia so we need to communicate through his wife in [place] she is to be an ICU nurse at [employer]  This is reassuring he does not have evidence of severe flow-limiting stenosis and I think we should continue medications.

## 2021-06-26 NOTE — Telephone Encounter (Signed)
Spoke with patient regarding results and recommendation.  Patient verbalizes understanding and is agreeable to plan of care. Advised patient to call back with any issues or concerns.  

## 2021-06-28 NOTE — Progress Notes (Signed)
Cardiology Office Note:    Date:  06/29/2021   ID:  David Hester, [place] 03-11-1946, MRN [id number]  PCP:  David Kid, MD  Cardiologist:  David More, MD    Referring MD: David Kid, MD    ASSESSMENT:    1. Coronary artery disease of native artery of native heart with stable angina pectoris ([place])   2. Hypertensive heart disease with chronic systolic congestive heart failure ([place])   3. Dilated cardiomyopathy ([place])   4. CKD (chronic kidney disease), stage II   5. Hyperlipidemia, unspecified hyperlipidemia type    PLAN:    In order of problems listed above:  From cardiology perspective he is done well having no angina on current medical therapy has no focal stenotic flow-limiting stenosis on cardiac CTA continue his medical therapy including aspirin beta-blocker and his high intensity statin. Stable reduce his diuretic with worsening kidney function blood pressure in the range and continue his guideline therapy including Entresto beta-blocker diuretic and is not on MRA with his CKD Continue current lipid-lowering Gout being managed by podiatry   Next appointment: 6 months   Medication Adjustments/Labs and Tests Ordered: Current medicines are reviewed at length with the patient today.  Concerns regarding medicines are outlined above.  No orders of the defined types were placed in this encounter.  No orders of the defined types were placed in this encounter.   Chief Complaint  Patient presents with   Follow-up   Coronary Artery Disease     David Hester is a 76 y.o. male with a hx of non-Hodgkin's lymphoma with severe cardiomyopathy related to chemotherapy and mild CAD PVCs and cognitive dysfunction last ejection fraction 30 to 40%.  He was last seen 11/04/2020. Compliance with diet, lifestyle and medications: Yes  His wife and RN is present I have reviewed by phone and today the results of his cardiac CTA From he has done well doing better having no  angina and has had no further episodes of weakness after reduce the dose of his Entresto. His renal function has worsened he has no edema and will reduce his diuretic frequency No shortness of breath palpitation or syncope. 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  Subsequent FFR was normal. Past Medical History:  Diagnosis Date   Bilateral wrist pain 07/11/2015   CAD (coronary artery disease), native coronary artery 03/09/2015   single vessel 60 percent PDA stenosis by previous catheterization. single vessel 60 percent PDA stenosis by previous catheterization.  Formatting of this note might be different from the original. single vessel 60 percent PDA stenosis by previous catheterization. Formatting of this note might be different from the original. Overview:  single vessel 60 percent PDA stenosis by previous catheterizati   Cognitive changes 07/11/2015   Dilated cardiomyopathy ([place]) 03/20/2018   Glucosuria 08/20/2018   High cholesterol    History of B-cell lymphoma 07/11/2015   History of small bowel obstruction 03/30/2015   Hyperglycemia 03/30/2015   Hyperlipidemia 03/09/2015   Hypertension    Hypertensive heart disease 03/09/2015  Insomnia 07/11/2015   Medicare annual wellness visit, subsequent 03/29/2016   MI (myocardial infarction) ([place])    Moderate episode of recurrent major depressive disorder ([place]) 03/29/2016   Personal history of cerebral atherosclerosis 06/25/2017   Small bowel obstruction ([place]) 03/30/2015   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   Ventricular  premature depolarization 03/09/2015    Past Surgical History:  Procedure Laterality Date   BACK SURGERY     BASAL CELL CARCINOMA EXCISION     CARDIAC CATHETERIZATION     CARDIC CATHERERIZATION     CERVICAL SPINE SURGERY     HERNIA REPAIR     I & D EXTREMITY Right 07/31/2013   Procedure: Exploration and Repair Complex Laceration Right Index Finger;  Surgeon: David Amor, MD;  Location: [facility];  Service: Orthopedics;  Laterality: Right;   KIDNEY STONE SURGERY     KNEE SUGERY      PR EXPLORATORY OF ABDOMEN   2016   SHOULDER SURGERY     SMALL INTESTINE SURGERY      Current Medications: Current Meds  Medication Sig   acetaminophen (TYLENOL) 650 MG CR tablet Take 650 mg by mouth every 8 (eight) hours as needed for pain.   ammonium lactate (LAC-HYDRIN) 12 % lotion Apply 1 application topically as needed for dry skin.   aspirin 81 MG tablet Take 81 mg by mouth daily.   carvedilol (COREG) 6.25 MG tablet Take 0.5 tablets (3.125 mg total) by mouth 2 (two) times daily.   escitalopram (LEXAPRO) 10 MG tablet Take 10 mg by mouth daily.   furosemide (LASIX) 20 MG tablet Take 20 mg by mouth daily. Take an additional 20 mg if gains 3 pds in 1 day   ibuprofen (ADVIL,MOTRIN) 200 MG tablet Take 200 mg by mouth every 6 (six) hours as needed for mild pain.   nitroGLYCERIN (NITROSTAT) 0.4 MG SL tablet Place 1 tablet (0.4 mg total) under the tongue every 5 (five) minutes as needed for chest pain.   rosuvastatin (CRESTOR) 10 MG tablet Take 1 tablet (10 mg total) by mouth daily.   sacubitril-valsartan (ENTRESTO) 49-51 MG Take 1 tablet by mouth 2 (two) times daily.   sildenafil (REVATIO) 20 MG tablet Take 20 mg by mouth daily as needed (erectile dysfunction). 1 - 5 tablets as needed   Testosterone Cypionate 200 MG/ML SOLN Inject 1.5 mLs as directed every 21 ( twenty-one) days.   traMADol (ULTRAM) 50 MG tablet Take 50 mg by mouth every 8 (eight) hours as needed. for pain   Wheat Dextrin (BENEFIBER DRINK  MIX PO) Take 1 packet by mouth 2 (two) times daily.    zolpidem (AMBIEN) 10 MG tablet Take 10 mg by mouth at bedtime as needed for sleep.      Allergies:   Donepezil, Ivp dye [iodinated contrast media], and Sulfa antibiotics   Social History   Socioeconomic History   Marital status: Married    Spouse name: Not on file   Number of children: Not on file   Years of education: Not on file   Highest education level: Not on file  Occupational History   Not on file  Tobacco Use   Smoking status: Former    Types: Cigarettes   Smokeless tobacco: Never  Vaping Use   Vaping Use: Never used  Substance and Sexual Activity   Alcohol use: No   Drug use: No   Sexual activity: Not on file  Other Topics Concern   Not on file  Social History Narrative  Not on file   Social Determinants of Health   Financial Resource Strain: Not on file  Food Insecurity: Not on file  Transportation Needs: Not on file  Physical Activity: Not on file  Stress: Not on file  Social Connections: Not on file     Family History: The patient's family history includes Breast cancer in his mother; Colon cancer in his brother. ROS:   Please see the history of present illness.    All other systems reviewed and are negative.  EKGs/Labs/Other Studies Reviewed:    The following studies were reviewed today:   06/23/2021: Sodium 133 potassium 5: Creatinine 1.87 GFR 37 Recent Labs: 05/03/2021: BUN 18; Creatinine, Ser 1.29; Potassium 5.3; Sodium 136  Recent Lipid Panel    Component Value Date/Time   CHOL 176 10/20/2019 1143   TRIG 302 (H) 10/20/2019 1143   HDL 32 (L) 10/20/2019 1143   CHOLHDL 5.5 (H) 10/20/2019 1143   LDLCALC 93 10/20/2019 1143  09/27/2020: Cholesterol 203 triglycerides 41 HDL 33 non-HDL cholesterol 170 LDL 100   Physical Exam:    VS:  BP 110/60 (BP Location: Right Arm, Patient Position: Sitting, Cuff Size: Normal)    Pulse 84    Ht 5\' 10"  (1.778 m)    Wt 172 lb (78 kg)    SpO2 96%     BMI 24.68 kg/m     Wt Readings from Last 3 Encounters:  06/29/21 172 lb (78 kg)  05/03/21 181 lb (82.1 kg)  11/04/20 178 lb (80.7 kg)     GEN:  Well nourished, well developed in no acute distress HEENT: Normal NECK: No JVD; No carotid bruits LYMPHATICS: No lymphadenopathy CARDIAC: RRR, no murmurs, rubs, gallops RESPIRATORY:  Clear to auscultation without rales, wheezing or rhonchi  ABDOMEN: Soft, non-tender, non-distended MUSCULOSKELETAL:  No edema; No deformity  SKIN: Warm and dry NEUROLOGIC:  Alert and oriented x 3 PSYCHIATRIC:  Normal affect    Signed, David More, MD  06/29/2021 11:34 AM    [place]

## 2021-06-29 ENCOUNTER — Ambulatory Visit (INDEPENDENT_AMBULATORY_CARE_PROVIDER_SITE_OTHER): Payer: Medicare HMO | Admitting: Cardiology

## 2021-06-29 ENCOUNTER — Encounter: Payer: Self-pay | Admitting: Cardiology

## 2021-06-29 ENCOUNTER — Other Ambulatory Visit: Payer: Self-pay

## 2021-06-29 VITALS — BP 110/60 | HR 84 | Ht 70.0 in | Wt 172.0 lb

## 2021-06-29 DIAGNOSIS — I110 Hypertensive heart disease with heart failure: Secondary | ICD-10-CM

## 2021-06-29 DIAGNOSIS — I5022 Chronic systolic (congestive) heart failure: Secondary | ICD-10-CM

## 2021-06-29 DIAGNOSIS — I420 Dilated cardiomyopathy: Secondary | ICD-10-CM

## 2021-06-29 DIAGNOSIS — I25118 Atherosclerotic heart disease of native coronary artery with other forms of angina pectoris: Secondary | ICD-10-CM | POA: Diagnosis not present

## 2021-06-29 DIAGNOSIS — N182 Chronic kidney disease, stage 2 (mild): Secondary | ICD-10-CM

## 2021-06-29 DIAGNOSIS — E785 Hyperlipidemia, unspecified: Secondary | ICD-10-CM

## 2021-06-29 NOTE — Patient Instructions (Signed)
Medication Instructions:  Your physician has recommended you make the following change in your medication:   Decrease your Furosemide to 20 mg on Monday, Wednesday and Friday.  *If you need a refill on your cardiac medications before your next appointment, please call your pharmacy*   Lab Work: None ordered If you have labs (blood work) drawn today and your tests are completely normal, you will receive your results only by: [place] (if you have MyChart) OR A paper copy in the mail If you have any lab test that is abnormal or we need to change your treatment, we will call you to review the results.   Testing/Procedures: None ordered   Follow-Up: At [facility], you and your health needs are our priority.  As part of our continuing mission to provide you with exceptional heart care, we have created designated Provider Care Teams.  These Care Teams include your primary Cardiologist (physician) and Advanced Practice Providers (APPs -  Physician Assistants and Nurse Practitioners) who all work together to provide you with the care you need, when you need it.  We recommend signing up for the patient portal called "MyChart".  Sign up information is provided on this After Visit Summary.  MyChart is used to connect with patients for Virtual Visits (Telemedicine).  Patients are able to view lab/test results, encounter notes, upcoming appointments, etc.  Non-urgent messages can be sent to your provider as well.   To learn more about what you can do with MyChart, go to [email or web address].    Your next appointment:   6 month(s)  The format for your next appointment:   In Person  Provider:   Shirlee More, MD   Other Instructions NA

## 2021-07-06 DIAGNOSIS — F0280 Dementia in other diseases classified elsewhere without behavioral disturbance: Secondary | ICD-10-CM | POA: Insufficient documentation

## 2021-08-10 ENCOUNTER — Other Ambulatory Visit: Payer: Self-pay | Admitting: Cardiology

## 2021-09-14 ENCOUNTER — Other Ambulatory Visit: Payer: Self-pay | Admitting: Cardiology

## 2021-12-02 ENCOUNTER — Other Ambulatory Visit: Payer: Self-pay | Admitting: Cardiology

## 2021-12-13 ENCOUNTER — Other Ambulatory Visit: Payer: Self-pay | Admitting: Cardiology

## 2021-12-13 DIAGNOSIS — M1A0711 Idiopathic chronic gout, right ankle and foot, with tophus (tophi): Secondary | ICD-10-CM

## 2021-12-13 NOTE — Telephone Encounter (Signed)
Rx refill sent to pharmacy. 

## 2021-12-26 ENCOUNTER — Other Ambulatory Visit: Payer: Self-pay

## 2021-12-26 NOTE — Progress Notes (Unsigned)
Cardiology Office Note:    Date:  12/27/2021   ID:  David Hester, David Hester 1946/06/14, MRN [id number]  PCP:  David Kid, MD  Cardiologist:  David More, MD    Referring MD: David Kid, MD    ASSESSMENT:    1. Hypertensive heart disease with chronic systolic congestive heart failure ([place])   2. Coronary artery disease of native artery of native heart with stable angina pectoris ([place])   3. Hyperlipidemia, unspecified hyperlipidemia type   4. CKD (chronic kidney disease), stage II   5. Dementia, unspecified dementia severity, unspecified dementia type, unspecified whether behavioral, psychotic, or mood disturbance or anxiety ([place])    PLAN:    In order of problems listed above:  Overall David Hester is doing well with his cardiomyopathy he is on maximally tolerated GDMT including minimum dose of carvedilol mid range Entresto and with these hypotensive episodes I would not add SGLT2 inhibitor or spironolactone.  We will recheck his echocardiogram in 2 years continue his current loop diuretic he has no fluid overload his weights at home are gradually decreased. Stable CAD continue medical therapy including aspirin beta-blocker and his high intensity statin. Lipids at target continue with statin We will recheck his renal function today with CKD He has improved with Namenda I see no problems or contraindication with his cardiac medications   Next appointment: 6 months   Medication Adjustments/Labs and Tests Ordered: Current medicines are reviewed at length with the patient today.  Concerns regarding medicines are outlined above.  Orders Placed This Encounter  Procedures   CBC   Basic Metabolic Panel (BMET)   Pro b natriuretic peptide   ECHOCARDIOGRAM COMPLETE   No orders of the defined types were placed in this encounter.   Chief Complaint  Patient presents with   Follow-up   Cardiomyopathy   Coronary Artery Disease    History of Present Illness:    David Hester is a 76 y.o. 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  last seen 06/29/2021  Compliance with diet, lifestyle and medications: Yes  His wife a retired Environmental consultant is present participates in his care and supervises especially with his dementia improved with immunotherapy Infrequently gets episodes of weakness his heart rate will be in the 110s blood pressure of 38-75 systolic and he quickly recovers and she will hold the dose of carvedilol Otherwise he has done well does not have edema shortness of breath orthopnea chest pain palpitation or syncope  Cardiac CTA reported 05/18/2021 showed a very high calcium score 4969 and presents 25 to 49% proximal LAD 50 to 69% mid 50 to 69% for diagonal branch left circumflex 20 to 49% proximal and mid second marginal branch 20 to 49% proximal right coronary artery 25 to 49% mid vessel PDA 25 to 49%..  Subsequent FFR was normal.   Recent labs 08/31/2021: Cholesterol 134 LDL 72 triglyceride 212 HDL 37 Past Medical History:  Diagnosis Date   Alzheimer's disease ([place]) 07/06/2021   Arthritis of carpometacarpal (CMC) joint of right thumb 12/13/2020   Benign hypertension with CKD (chronic kidney disease) stage III ([place]) 03/09/2015   Bilateral wrist pain 07/11/2015   CAD (coronary artery disease), native coronary artery 03/09/2015   single vessel 60 percent PDA stenosis by previous catheterization. single vessel 60 percent PDA stenosis by previous catheterization.  Formatting of this note might be different from the  original. single vessel 60 percent PDA stenosis by previous catheterization. Formatting of this note might be different from the original. 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 With traumatic toe amputation and neuropathy right foot.   Glucosuria 08/20/2018   High cholesterol    History of B-cell lymphoma 07/11/2015   History of small bowel obstruction 03/30/2015   Hyperglycemia 03/30/2015   Hyperlipidemia 03/09/2015   Hypertension    Hypertensive heart disease 03/09/2015   Insomnia 07/11/2015   Lumbar radiculopathy 05/31/2014   Lumbar spondylosis 05/31/2014   Medicare annual wellness visit, subsequent 03/29/2016   MI (myocardial infarction) ([place])    Moderate episode of recurrent major depressive disorder ([place]) 03/29/2016   Neurogenic claudication 06/30/2019   Old MI (myocardial infarction) 01/03/2021   Personal history of cerebral atherosclerosis 06/25/2017   Small bowel obstruction ([place]) 03/30/2015   Spinal stenosis, 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   Stage 3a chronic kidney disease ([place]) 09/27/2020   Sundowning 05/22/2021   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   Trigger middle finger of right hand 12/13/2020   Triple vessel coronary artery disease 05/22/2021   Ventricular premature depolarization 03/09/2015    Past Surgical History:  Procedure Laterality Date   BACK SURGERY     BASAL CELL CARCINOMA EXCISION     CARDIAC CATHETERIZATION     CARDIC CATHERERIZATION     CERVICAL SPINE SURGERY     HERNIA REPAIR     I & D EXTREMITY Right 07/31/2013   Procedure: Exploration and Repair Complex Laceration Right Index Finger;  Surgeon: Schuyler Amor, MD;  Location: [facility];  Service: Orthopedics;  Laterality: Right;   KIDNEY STONE SURGERY     KNEE SUGERY      PR EXPLORATORY OF ABDOMEN   2016   SHOULDER  SURGERY     SMALL INTESTINE SURGERY      Current Medications: Current Meds  Medication Sig   acetaminophen (TYLENOL) 650 MG CR tablet Take 650 mg by mouth every 8 (eight) hours as needed for pain.   allopurinol (ZYLOPRIM) 100 MG tablet Take 100 mg by mouth daily.   ammonium lactate (LAC-HYDRIN) 12 % lotion Apply 1 application topically as needed for dry skin.   aspirin 81 MG tablet Take 81 mg by mouth daily.   b complex vitamins tablet Take 1 tablet by mouth daily.   carvedilol (COREG) 6.25 MG tablet Take 0.5 tablets (3.125 mg total) by mouth 2 (two) times daily.   Cholecalciferol (VITAMIN D3) 125 MCG (5000 UT) CAPS Take 5,000 Units by mouth daily.   colchicine 0.6 MG tablet Take 0.6 mg by mouth as needed (gout).   Cyanocobalamin (VITAMIN B-12) 5000 MCG SUBL Place 1 tablet under the tongue daily.   escitalopram (LEXAPRO) 10 MG tablet Take 10 mg by mouth daily.   furosemide (LASIX) 20 MG tablet Take 1 tablet (20 mg total) by mouth 3 (three) times a week. Take 20 mg on Monday, Wednesday and Friday.   ibuprofen (ADVIL,MOTRIN) 200 MG tablet Take 200 mg by mouth every 6 (six) hours as  needed for mild pain.   latanoprost (XALATAN) 0.005 % ophthalmic solution Place 1 drop into both eyes at bedtime.   memantine (NAMENDA) 5 MG tablet Take 5 mg by mouth every morning.   MULTIPLE VITAMIN PO Take 1 tablet by mouth daily.   nitroGLYCERIN (NITROSTAT) 0.4 MG SL tablet Place 1 tablet (0.4 mg total) under the tongue every 5 (five) minutes as needed for chest pain.   Omega-3 Fatty Acids (FISH OIL) 1000 MG CAPS Take 1 capsule (1,000 mg total) by mouth daily.   rosuvastatin (CRESTOR) 10 MG tablet Take 10 mg by mouth daily.   sacubitril-valsartan (ENTRESTO) 49-51 MG Take 1 tablet by mouth 2 (two) times daily.   sildenafil (REVATIO) 20 MG tablet Take 20 mg by mouth daily as needed (erectile dysfunction). 1 - 5 tablets as needed   testosterone cypionate (DEPOTESTOSTERONE CYPIONATE) 200 MG/ML injection Inject 1.5  mLs into the muscle every 21 ( twenty-one) days.   traMADol (ULTRAM) 50 MG tablet Take 50 mg by mouth every 8 (eight) hours as needed. for pain   Wheat Dextrin (BENEFIBER DRINK MIX PO) Take 1 packet by mouth 2 (two) times daily.    zolpidem (AMBIEN) 10 MG tablet Take 10 mg by mouth at bedtime as needed for sleep.      Allergies:   Donepezil, Ivp dye [iodinated contrast media], and Sulfa antibiotics   Social History   Socioeconomic History   Marital status: Married    Spouse name: Not on file   Number of children: Not on file   Years of education: Not on file   Highest education level: Not on file  Occupational History   Not on file  Tobacco Use   Smoking status: Former    Types: Cigarettes   Smokeless tobacco: Never  Vaping Use   Vaping Use: Never used  Substance and Sexual Activity   Alcohol use: No   Drug use: No   Sexual activity: Not on file  Other Topics Concern   Not on file  Social History Narrative   Not on file   Social Determinants of Health   Financial Resource Strain: Not on file  Food Insecurity: Not on file  Transportation Needs: Not on file  Physical Activity: Not on file  Stress: Not on file  Social Connections: Not on file     Family History: The patient's family history includes Breast cancer in his mother; Colon cancer in his brother. ROS:   Please see the history of present illness.    All other systems reviewed and are negative.  EKGs/Labs/Other Studies Reviewed:    The following studies were reviewed today:    Recent Labs: 05/03/2021: BUN 18; Creatinine, Ser 1.29; Potassium 5.3; Sodium 136  Recent Lipid Panel    Component Value Date/Time   CHOL 176 10/20/2019 1143   TRIG 302 (H) 10/20/2019 1143   HDL 32 (L) 10/20/2019 1143   CHOLHDL 5.5 (H) 10/20/2019 1143   LDLCALC 93 10/20/2019 1143    Physical Exam:    VS:  BP 119/63   Pulse 86   Ht 5' 10.6" (1.793 m)   Wt 172 lb 12.8 oz (78.4 kg)   SpO2 95%   BMI 24.37 kg/m     Wt  Readings from Last 3 Encounters:  12/27/21 172 lb 12.8 oz (78.4 kg)  06/29/21 172 lb (78 kg)  05/03/21 181 lb (82.1 kg)     GEN:  Well nourished, well developed in no acute distress HEENT: Normal NECK: No  JVD; No carotid bruits LYMPHATICS: No lymphadenopathy CARDIAC: RRR, no murmurs, rubs, gallops RESPIRATORY:  Clear to auscultation without rales, wheezing or rhonchi  ABDOMEN: Soft, non-tender, non-distended MUSCULOSKELETAL:  No edema; No deformity  SKIN: Warm and dry NEUROLOGIC:  Alert and oriented x 3 PSYCHIATRIC:  Normal affect    Signed, David More, MD  12/27/2021 11:23 AM    [place]

## 2021-12-27 ENCOUNTER — Encounter: Payer: Self-pay | Admitting: Cardiology

## 2021-12-27 ENCOUNTER — Ambulatory Visit: Payer: Medicare HMO | Admitting: Cardiology

## 2021-12-27 VITALS — BP 119/63 | HR 86 | Ht 70.6 in | Wt 172.8 lb

## 2021-12-27 DIAGNOSIS — I5022 Chronic systolic (congestive) heart failure: Secondary | ICD-10-CM

## 2021-12-27 DIAGNOSIS — E785 Hyperlipidemia, unspecified: Secondary | ICD-10-CM | POA: Diagnosis not present

## 2021-12-27 DIAGNOSIS — N182 Chronic kidney disease, stage 2 (mild): Secondary | ICD-10-CM

## 2021-12-27 DIAGNOSIS — I25118 Atherosclerotic heart disease of native coronary artery with other forms of angina pectoris: Secondary | ICD-10-CM | POA: Diagnosis not present

## 2021-12-27 DIAGNOSIS — F0390 Unspecified dementia without behavioral disturbance: Secondary | ICD-10-CM

## 2021-12-27 DIAGNOSIS — I110 Hypertensive heart disease with heart failure: Secondary | ICD-10-CM

## 2021-12-27 NOTE — Patient Instructions (Signed)
Medication Instructions:  Your physician recommends that you continue on your current medications as directed. Please refer to the Current Medication list given to you today.  *If you need a refill on your cardiac medications before your next appointment, please call your pharmacy*   Lab Work: Your physician recommends that you return for lab work in:   Labs today: CBC, BMP, Pro BNP  If you have labs (blood work) drawn today and your tests are completely normal, you will receive your results only by: MyChart Message (if you have MyChart) OR A paper copy in the mail If you have any lab test that is abnormal or we need to change your treatment, we will call you to review the results.   Testing/Procedures: Your physician has requested that you have an echocardiogram. Echocardiography is a painless test that uses sound waves to create images of your heart. It provides your doctor with information about the size and shape of your heart and how well your heart's chambers and valves are working. This procedure takes approximately one hour. There are no restrictions for this procedure.    Follow-Up: At [facility], you and your health needs are our priority.  As part of our continuing mission to provide you with exceptional heart care, we have created designated Provider Care Teams.  These Care Teams include your primary Cardiologist (physician) and Advanced Practice Providers (APPs -  Physician Assistants and Nurse Practitioners) who all work together to provide you with the care you need, when you need it.  We recommend signing up for the patient portal called "MyChart".  Sign up information is provided on this After Visit Summary.  MyChart is used to connect with patients for Virtual Visits (Telemedicine).  Patients are able to view lab/test results, encounter notes, upcoming appointments, etc.  Non-urgent messages can be sent to your provider as well.   To learn more about what you can do  with MyChart, go to [email or web address].    Your next appointment:   6 month(s)  The format for your next appointment:   In Person  Provider:   Shirlee More, MD    Other Instructions None  Important Information About Sugar

## 2021-12-28 LAB — CBC
Hematocrit: 47.7 % (ref 37.5–51.0)
Hemoglobin: 15.5 g/dL (ref 13.0–17.7)
MCH: 32.8 pg (ref 26.6–33.0)
MCHC: 32.5 g/dL (ref 31.5–35.7)
MCV: 101 fL — ABNORMAL HIGH (ref 79–97)
Platelets: 210 10*3/uL (ref 150–450)
RBC: 4.73 x10E6/uL (ref 4.14–5.80)
RDW: 14.9 % (ref 11.6–15.4)
WBC: 4.9 10*3/uL (ref 3.4–10.8)

## 2021-12-28 LAB — BASIC METABOLIC PANEL
BUN/Creatinine Ratio: 12 (ref 10–24)
BUN: 17 mg/dL (ref 8–27)
CO2: 20 mmol/L (ref 20–29)
Calcium: 9.5 mg/dL (ref 8.6–10.2)
Chloride: 102 mmol/L (ref 96–106)
Creatinine, Ser: 1.45 mg/dL — ABNORMAL HIGH (ref 0.76–1.27)
Glucose: 99 mg/dL (ref 70–99)
Potassium: 4.4 mmol/L (ref 3.5–5.2)
Sodium: 138 mmol/L (ref 134–144)
eGFR: 49 mL/min/{1.73_m2} — ABNORMAL LOW (ref >59)

## 2021-12-28 LAB — PRO B NATRIURETIC PEPTIDE: NT-Pro BNP: 330 pg/mL (ref 0–486)

## 2022-01-09 ENCOUNTER — Ambulatory Visit (INDEPENDENT_AMBULATORY_CARE_PROVIDER_SITE_OTHER): Payer: Medicare HMO

## 2022-01-09 DIAGNOSIS — I25118 Atherosclerotic heart disease of native coronary artery with other forms of angina pectoris: Secondary | ICD-10-CM | POA: Diagnosis not present

## 2022-01-09 DIAGNOSIS — E785 Hyperlipidemia, unspecified: Secondary | ICD-10-CM | POA: Diagnosis not present

## 2022-01-09 DIAGNOSIS — I110 Hypertensive heart disease with heart failure: Secondary | ICD-10-CM | POA: Diagnosis not present

## 2022-01-09 DIAGNOSIS — N182 Chronic kidney disease, stage 2 (mild): Secondary | ICD-10-CM | POA: Diagnosis not present

## 2022-01-09 DIAGNOSIS — I5022 Chronic systolic (congestive) heart failure: Secondary | ICD-10-CM

## 2022-01-09 LAB — ECHOCARDIOGRAM COMPLETE
Area-P 1/2: 3.51 cm2
S' Lateral: 3.9 cm

## 2022-05-05 ENCOUNTER — Other Ambulatory Visit: Payer: Self-pay | Admitting: Cardiology

## 2022-05-07 NOTE — Telephone Encounter (Signed)
Refills to pharmacy 

## 2022-06-20 ENCOUNTER — Other Ambulatory Visit: Payer: Self-pay | Admitting: Cardiology

## 2022-06-20 NOTE — Telephone Encounter (Signed)
Refill sent to  [id number] [place], [place] - [address]

## 2022-06-25 ENCOUNTER — Other Ambulatory Visit: Payer: Self-pay | Admitting: Cardiology

## 2022-06-25 NOTE — Progress Notes (Unsigned)
Cardiology Office Note:    Date:  06/26/2022   ID:  [name], [place] Jan 29, 1946, MRN [id number]  PCP:  Dineen Kid, MD  Cardiologist:  Shirlee More, MD    Referring MD: Dineen Kid, MD    ASSESSMENT:    1. Hypertensive heart disease with chronic systolic congestive heart failure ([place])   2. Coronary artery disease of native artery of native heart with stable angina pectoris ([place])   3. CKD (chronic kidney disease), stage II   4. Ventricular premature depolarization   5. Mixed hyperlipidemia    PLAN:    In order of problems listed above:  Christos continues to do well blood pressure is controlled heart failure compensated he will continue his current loop diuretic beta-blocker Entresto and he is not on SGLT 2 inhibitor or MRA with his CKD and close supervision by his wife along with his beta-blocker and will recheck labs including renal function potassium today Stable CAD having no anginal discomfort continue medical therapy including aspirin and statin Stable CKD will recheck his renal function today Stable ventricular arrhythmia very little symptomatology only 1 present on EKG continue his beta-blocker He is on a high intensity statin with CAD will continue as well as checking his lipid profile and liver function today   Next appointment: Yes   Medication Adjustments/Labs and Tests Ordered: Current medicines are reviewed at length with the patient today.  Concerns regarding medicines are outlined above.  No orders of the defined types were placed in this encounter.  No orders of the defined types were placed in this encounter.   Chief Complaint  Patient presents with   Follow-up   Coronary Artery Disease   Congestive Heart Failure    History of Present Illness:    Dionysios Massman is a 77 y.o. 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 last seen 12/27/2021 check.  Compliance with diet, lifestyle and medications: Yes  His wife are registered nurses present she watches him closely supervise medications dementia heart rate remains 60-80 blood pressure 07/08/1940 and weight stable at home He has occasional palpitation not severe not sustained He is not having edema shortness of breath chest pain palpitation or syncope he has not had any recurrent hypotensive episodes He tolerates his statin without muscle pain or weakness    Echocardiogram 01/09/2022 showed moderately decreased EF 35 to 40% mild LVH normal right ventricular size and function and no valvular abnormality.  1. Left ventricular ejection fraction, by estimation, is 35 to 40%. The  left ventricle has moderately decreased function. The left ventricle has  no regional wall motion abnormalities. There is mild left ventricular  hypertrophy. Left ventricular  diastolic parameters are consistent with Grade I diastolic dysfunction  (impaired relaxation).   2. Right ventricular systolic function is normal. The right ventricular  size is normal.   3. The mitral valve is normal in structure. No evidence of mitral valve  regurgitation. No evidence of mitral stenosis.   4. The aortic valve is normal in structure. Aortic valve regurgitation is  not visualized. No aortic stenosis is present.   5. The inferior vena cava is normal in size with greater than 50%  respiratory variability, suggesting right atrial pressure of 3 mmHg.  Cardiac CTA reported 05/18/2021 showed a very high calcium score 4969 and presents 25 to 49% proximal LAD 50 to 69% mid 50 to 69%  for diagonal branch left circumflex 20 to 49% proximal and mid second marginal branch 20 to 49% proximal right coronary artery 25 to 49% mid vessel PDA 25 to 49%..  Subsequent FFR was normal.   Past Medical History:  Diagnosis Date   Alzheimer's disease ([place]) 07/06/2021   Arthritis of carpometacarpal [facility]) joint of  right thumb 12/13/2020   Benign hypertension with CKD (chronic kidney disease) stage III ([place]) 03/09/2015   Bilateral wrist pain 07/11/2015   CAD (coronary artery disease), native coronary artery 03/09/2015   single vessel 60 percent PDA stenosis by previous catheterization. single vessel 60 percent PDA stenosis by previous catheterization.  Formatting of this note might be different from the original. single vessel 60 percent PDA stenosis by previous catheterization. Formatting of this note might be different from the original. 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 With traumatic toe amputation and neuropathy right foot.   Glucosuria 08/20/2018   High cholesterol    History of B-cell lymphoma 07/11/2015   History of small bowel obstruction 03/30/2015   Hyperglycemia 03/30/2015   Hyperlipidemia 03/09/2015   Hypertension    Hypertensive heart disease 03/09/2015   Insomnia 07/11/2015   Lumbar radiculopathy 05/31/2014   Lumbar spondylosis 05/31/2014   Medicare annual wellness visit, subsequent 03/29/2016   MI (myocardial infarction) ([place])    Moderate episode of recurrent major depressive disorder ([place]) 03/29/2016   Neurogenic claudication 06/30/2019   Old MI (myocardial infarction) 01/03/2021   Personal history of cerebral atherosclerosis 06/25/2017   Small bowel obstruction ([place]) 03/30/2015   Spinal stenosis, 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   Stage 3a chronic kidney disease ([place]) 09/27/2020    Sundowning 05/22/2021   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   Trigger middle finger of right hand 12/13/2020   Triple vessel coronary artery disease 05/22/2021   Ventricular premature depolarization 03/09/2015    Past Surgical History:  Procedure Laterality Date   BACK SURGERY     BASAL CELL CARCINOMA EXCISION     CARDIAC CATHETERIZATION     CARDIC CATHERERIZATION     CERVICAL SPINE SURGERY     HERNIA REPAIR     I & D EXTREMITY Right 07/31/2013   Procedure: Exploration and Repair Complex Laceration Right Index Finger;  Surgeon: Schuyler Amor, MD;  Location: [facility];  Service: Orthopedics;  Laterality: Right;   KIDNEY STONE SURGERY     KNEE SUGERY      PR EXPLORATORY OF ABDOMEN   2016   SHOULDER SURGERY     SMALL INTESTINE SURGERY      Current Medications: Current Meds  Medication Sig   acetaminophen (TYLENOL) 650 MG CR tablet Take 650 mg by mouth every 8 (eight) hours as needed for pain.   allopurinol (ZYLOPRIM) 100 MG tablet Take 100 mg by mouth daily.   ammonium lactate (LAC-HYDRIN) 12 % lotion Apply 1 application topically as needed for dry skin.   aspirin 81 MG tablet Take 81 mg by mouth daily.   carvedilol (COREG) 6.25 MG tablet Take 0.5 tablets (3.125 mg total) by mouth  2 (two) times daily.   colchicine 0.6 MG tablet Take 0.6 mg by mouth as needed (gout).   ENTRESTO 49-51 MG TAKE 1 TABLET BY MOUTH TWICE A DAY   escitalopram (LEXAPRO) 10 MG tablet Take 10 mg by mouth daily.   furosemide (LASIX) 20 MG tablet Take 1 tablet (20 mg total) by mouth 3 (three) times a week. Take 20 mg on Monday, Wednesday and Friday.   ibuprofen (ADVIL,MOTRIN) 200 MG tablet Take 200 mg by mouth every 6 (six) hours as needed for mild pain.   latanoprost (XALATAN) 0.005 % ophthalmic solution Place 1 drop into both eyes at bedtime.   memantine (NAMENDA) 5 MG tablet Take 5 mg by mouth every morning.   MULTIPLE VITAMIN PO Take 1 tablet by mouth daily.   nitroGLYCERIN (NITROSTAT) 0.4 MG SL tablet Place 1  tablet (0.4 mg total) under the tongue every 5 (five) minutes as needed for chest pain.   Omega-3 Fatty Acids (FISH OIL) 1000 MG CAPS Take 1 capsule (1,000 mg total) by mouth daily.   rosuvastatin (CRESTOR) 10 MG tablet TAKE 1 TABLET BY MOUTH EVERY DAY   sildenafil (REVATIO) 20 MG tablet Take 20 mg by mouth daily as needed (erectile dysfunction). 1 - 5 tablets as needed   testosterone cypionate (DEPOTESTOSTERONE CYPIONATE) 200 MG/ML injection Inject 1.5 mLs into the muscle every 21 ( twenty-one) days.   traMADol (ULTRAM) 50 MG tablet Take 50 mg by mouth every 8 (eight) hours as needed. for pain   Wheat Dextrin (BENEFIBER DRINK MIX PO) Take 1 packet by mouth 2 (two) times daily.    zolpidem (AMBIEN) 10 MG tablet Take 10 mg by mouth at bedtime as needed for sleep.      Allergies:   Donepezil, Ivp dye [iodinated contrast media], Sulfa antibiotics, and Sulfacetamide   Social History   Socioeconomic History   Marital status: Married    Spouse name: Not on file   Number of children: Not on file   Years of education: Not on file   Highest education level: Not on file  Occupational History   Not on file  Tobacco Use   Smoking status: Former    Types: Cigarettes   Smokeless tobacco: Never  Vaping Use   Vaping Use: Never used  Substance and Sexual Activity   Alcohol use: No   Drug use: No   Sexual activity: Not on file  Other Topics Concern   Not on file  Social History Narrative   Not on file   Social Determinants of Health   Financial Resource Strain: Not on file  Food Insecurity: Not on file  Transportation Needs: Not on file  Physical Activity: Not on file  Stress: Not on file  Social Connections: Not on file     Family History: The patient's family history includes Breast cancer in his mother; Colon cancer in his brother. ROS:   Please see the history of present illness.    All other systems reviewed and are negative.  EKGs/Labs/Other Studies Reviewed:    The  following studies were reviewed today:  EKG:  EKG ordered today and personally reviewed.  The ekg ordered today demonstrates sinus rhythm 1 PVC old inferior infarction otherwise normal EKG  Recent Labs: 12/27/2021: BUN 17; Creatinine, Ser 1.45; Hemoglobin 15.5; NT-Pro BNP 330; Platelets 210; Potassium 4.4; Sodium 138  Recent Lipid Panel    Component Value Date/Time   CHOL 176 10/20/2019 1143   TRIG 302 (H) 10/20/2019 1143   HDL  32 (L) 10/20/2019 1143   CHOLHDL 5.5 (H) 10/20/2019 1143   LDLCALC 93 10/20/2019 1143    Physical Exam:    VS:  BP 122/70 (BP Location: Right Arm, Patient Position: Sitting)   Pulse 83   Ht 5' 10.6" (1.793 m)   Wt 184 lb (83.5 kg)   SpO2 99%   BMI 25.95 kg/m     Wt Readings from Last 3 Encounters:  06/26/22 184 lb (83.5 kg)  12/27/21 172 lb 12.8 oz (78.4 kg)  06/29/21 172 lb (78 kg)     GEN:  Well nourished, well developed in no acute distress HEENT: Normal NECK: No JVD; No carotid bruits LYMPHATICS: No lymphadenopathy CARDIAC: RRR, no murmurs, rubs, gallops RESPIRATORY:  Clear to auscultation without rales, wheezing or rhonchi  ABDOMEN: Soft, non-tender, non-distended MUSCULOSKELETAL:  No edema; No deformity  SKIN: Warm and dry NEUROLOGIC:  Alert and oriented x 3 PSYCHIATRIC:  Normal affect    Signed, Shirlee More, MD  06/26/2022 1:02 PM    [place]

## 2022-06-26 ENCOUNTER — Ambulatory Visit: Payer: Medicare HMO | Attending: Cardiology | Admitting: Cardiology

## 2022-06-26 ENCOUNTER — Encounter: Payer: Self-pay | Admitting: Cardiology

## 2022-06-26 VITALS — BP 122/70 | HR 83 | Ht 70.6 in | Wt 184.0 lb

## 2022-06-26 DIAGNOSIS — I25118 Atherosclerotic heart disease of native coronary artery with other forms of angina pectoris: Secondary | ICD-10-CM | POA: Diagnosis not present

## 2022-06-26 DIAGNOSIS — I5022 Chronic systolic (congestive) heart failure: Secondary | ICD-10-CM

## 2022-06-26 DIAGNOSIS — I110 Hypertensive heart disease with heart failure: Secondary | ICD-10-CM

## 2022-06-26 DIAGNOSIS — I493 Ventricular premature depolarization: Secondary | ICD-10-CM | POA: Diagnosis not present

## 2022-06-26 DIAGNOSIS — E782 Mixed hyperlipidemia: Secondary | ICD-10-CM

## 2022-06-26 DIAGNOSIS — N182 Chronic kidney disease, stage 2 (mild): Secondary | ICD-10-CM | POA: Diagnosis not present

## 2022-06-26 NOTE — Patient Instructions (Signed)
Medication Instructions:  Your physician recommends that you continue on your current medications as directed. Please refer to the Current Medication list given to you today.  *If you need a refill on your cardiac medications before your next appointment, please call your pharmacy*   Lab Work: Your physician recommends that you return for lab work in:   Labs today: CMP, Lipids, CBC, Pro BNP  If you have labs (blood work) drawn today and your tests are completely normal, you will receive your results only by: MyChart Message (if you have MyChart) OR A paper copy in the mail If you have any lab test that is abnormal or we need to change your treatment, we will call you to review the results.   Testing/Procedures: None   Follow-Up: At [facility], you and your health needs are our priority.  As part of our continuing mission to provide you with exceptional heart care, we have created designated Provider Care Teams.  These Care Teams include your primary Cardiologist (physician) and Advanced Practice Providers (APPs -  Physician Assistants and Nurse Practitioners) who all work together to provide you with the care you need, when you need it.  We recommend signing up for the patient portal called "MyChart".  Sign up information is provided on this After Visit Summary.  MyChart is used to connect with patients for Virtual Visits (Telemedicine).  Patients are able to view lab/test results, encounter notes, upcoming appointments, etc.  Non-urgent messages can be sent to your provider as well.   To learn more about what you can do with MyChart, go to [email or web address].    Your next appointment:   6 month(s)  The format for your next appointment:   In Person  Provider:   Shirlee More, MD    Other Instructions None  Important Information About Sugar

## 2022-06-27 LAB — CBC
Hematocrit: 41.5 % (ref 37.5–51.0)
Hemoglobin: 13.9 g/dL (ref 13.0–17.7)
MCH: 31.6 pg (ref 26.6–33.0)
MCHC: 33.5 g/dL (ref 31.5–35.7)
MCV: 94 fL (ref 79–97)
Platelets: 272 10*3/uL (ref 150–450)
RBC: 4.4 x10E6/uL (ref 4.14–5.80)
RDW: 13.3 % (ref 11.6–15.4)
WBC: 6.7 10*3/uL (ref 3.4–10.8)

## 2022-06-27 LAB — COMPREHENSIVE METABOLIC PANEL
ALT: 14 IU/L (ref 0–44)
AST: 17 IU/L (ref 0–40)
Albumin/Globulin Ratio: 1.7 (ref 1.2–2.2)
Albumin: 4.3 g/dL (ref 3.8–4.8)
Alkaline Phosphatase: 61 IU/L (ref 44–121)
BUN/Creatinine Ratio: 11 (ref 10–24)
BUN: 17 mg/dL (ref 8–27)
Bilirubin Total: 0.3 mg/dL (ref 0.0–1.2)
CO2: 21 mmol/L (ref 20–29)
Calcium: 9.2 mg/dL (ref 8.6–10.2)
Chloride: 101 mmol/L (ref 96–106)
Creatinine, Ser: 1.5 mg/dL — ABNORMAL HIGH (ref 0.76–1.27)
Globulin, Total: 2.5 g/dL (ref 1.5–4.5)
Glucose: 171 mg/dL — ABNORMAL HIGH (ref 70–99)
Potassium: 4.9 mmol/L (ref 3.5–5.2)
Sodium: 137 mmol/L (ref 134–144)
Total Protein: 6.8 g/dL (ref 6.0–8.5)
eGFR: 47 mL/min/{1.73_m2} — ABNORMAL LOW (ref >59)

## 2022-06-27 LAB — LIPID PANEL
Chol/HDL Ratio: 4.2 ratio (ref 0.0–5.0)
Cholesterol, Total: 131 mg/dL (ref 100–199)
HDL: 31 mg/dL — ABNORMAL LOW (ref >39)
LDL Chol Calc (NIH): 44 mg/dL (ref 0–99)
Triglycerides: 377 mg/dL — ABNORMAL HIGH (ref 0–149)
VLDL Cholesterol Cal: 56 mg/dL — ABNORMAL HIGH (ref 5–40)

## 2022-06-27 LAB — PRO B NATRIURETIC PEPTIDE: NT-Pro BNP: 151 pg/mL (ref 0–486)

## 2022-08-17 DEATH — deceased

## 2022-12-17 ENCOUNTER — Other Ambulatory Visit: Payer: Self-pay | Admitting: Cardiology

## 2023-01-13 NOTE — Progress Notes (Deleted)
Cardiology Office Note:    Date:  01/13/2023   ID:  David Hester, David Hester 05/11/1946, MRN [id number]  PCP:  David Cooley, MD  Cardiologist:  David Herrlich, MD    Referring MD: David Cooley, MD    ASSESSMENT:    1. Dilated cardiomyopathy (HCC)   2. 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 Coronary artery disease of native artery of native heart with stable angina pectoris (HCC)   4. Ventricular premature depolarization   5. CKD (chronic kidney disease), stage II   6. Mixed hyperlipidemia    PLAN:    In order of problems listed above:  ***   Next appointment: ***   Medication Adjustments/Labs and Tests Ordered: Current medicines are reviewed at length with the patient today.  Concerns regarding medicines are outlined above.  No orders of the defined types were placed in this encounter.  No orders of the defined types were placed in this encounter.    History of Present Illness:    David Hester is a 77 y.o. 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  His last echocardiogram July 2025 showed EF of 35 to 40%.  Cardiac CTA December 2022 showed a very high calcium score 4969 and mild to moderate CAD involving the proximal LAD 50 to 69% mid 50 to 69% and less than 50% in the left circumflex marginals proximal right coronary artery and PDA. Compliance with diet, lifestyle and medications: *** Past Medical History:  Diagnosis Date   Alzheimer's disease (HCC) 07/06/2021   Arthritis of carpometacarpal [facility]) joint of right thumb 12/13/2020   Benign hypertension with CKD (chronic kidney disease) stage III (HCC) 03/09/2015   Bilateral wrist pain 07/11/2015   CAD (coronary artery disease), native coronary artery 03/09/2015   single vessel 60 percent PDA stenosis by previous catheterization. single vessel 60  percent PDA stenosis by previous catheterization.  Formatting of this note might be different from the original. single vessel 60 percent PDA stenosis by previous catheterization. Formatting of this note might be different from the original. 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 With traumatic toe amputation and neuropathy right foot.   Glucosuria 08/20/2018   High cholesterol    History of B-cell lymphoma 07/11/2015   History of small bowel obstruction 03/30/2015   Hyperglycemia 03/30/2015   Hyperlipidemia 03/09/2015   Hypertension    Hypertensive heart disease 03/09/2015   Insomnia 07/11/2015   Lumbar radiculopathy 05/31/2014   Lumbar spondylosis 05/31/2014   Medicare annual wellness visit, subsequent 03/29/2016   MI (myocardial infarction) (HCC)    Moderate episode of recurrent major depressive disorder (HCC) 03/29/2016   Neurogenic claudication 06/30/2019   Old MI (myocardial infarction) 01/03/2021   Personal history of cerebral atherosclerosis 06/25/2017   Small bowel obstruction (HCC) 03/30/2015   Spinal stenosis, 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   Stage 3a chronic kidney disease (HCC) 09/27/2020   Sundowning 05/22/2021   Testicular  hypofunction 07/11/2015   Trigger middle finger of right hand 12/13/2020   Triple vessel coronary artery disease 05/22/2021   Ventricular premature depolarization 03/09/2015    Current Medications: No outpatient medications have been marked as taking for the  01/14/23 encounter (Appointment) with Baldo Daub, MD.      EKGs/Labs/Other Studies Reviewed:    The following studies were reviewed today:  Cardiac Studies & Procedures     STRESS TESTS  MYOCARDIAL PERFUSION IMAGING 03/11/2018  Narrative  Nuclear stress EF: 28%.  The left ventricular ejection fraction is severely decreased (<30%).  This is an intermediate risk study.  No evidence of ischemia on this study.   ECHOCARDIOGRAM  ECHOCARDIOGRAM COMPLETE 01/09/2022  Narrative ECHOCARDIOGRAM REPORT    Patient Name:   David Hester Date of Exam: 01/09/2022 Medical Rec #:  [id number]             Height:       70.6 in Accession #:    [id number]            Weight:       172.8 lb Date of Birth:  1946/03/31             BSA:          1.974 m Patient Age:    89 years              BP:           119/63 mmHg Patient Gender: M                     HR:           82 bpm. 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 CHF, Previous Myocardial Infarction and CAD; Risk Factors:Hypertension and Dyslipidemia.  Sonographer:    Margreta Journey RDCS Referring Phys: [id number]  [id number]   IMPRESSIONS   1. Left ventricular ejection fraction, by estimation, is 35 to 40%. The left ventricle has moderately decreased function. The left ventricle has no regional wall motion abnormalities. There is mild left ventricular hypertrophy. Left ventricular diastolic parameters are  consistent with Grade I diastolic dysfunction (impaired relaxation). 2. Right ventricular systolic function is normal. The right ventricular size is normal. 3. The mitral valve is normal in structure. No evidence of mitral valve regurgitation. No evidence of mitral stenosis. 4. The aortic valve is normal in structure. Aortic valve regurgitation is not visualized. No aortic stenosis is present. 5. The inferior vena cava is normal in size with greater than 50% respiratory variability, suggesting right atrial pressure of 3 mmHg.  FINDINGS Left Ventricle: Left ventricular ejection fraction, by estimation, is 35 to 40%. The left ventricle has moderately decreased function. The left ventricle has no regional wall motion abnormalities. The left ventricular internal cavity size was normal in size. There is mild left ventricular hypertrophy. Left ventricular diastolic parameters are consistent with Grade I diastolic dysfunction (impaired relaxation).  Right Ventricle: The right ventricular size is normal. No increase in right ventricular wall thickness. Right ventricular systolic function is normal.  Left Atrium: Left atrial size was normal in size.  Right Atrium: Right atrial size was normal in size.  Pericardium: There is no evidence of pericardial effusion.  Mitral Valve: The mitral valve is normal in structure. No evidence of mitral valve regurgitation. No evidence of mitral valve stenosis.  Tricuspid Valve: The tricuspid valve is normal in structure. Tricuspid valve regurgitation is not demonstrated. No evidence of tricuspid stenosis.  Aortic Valve: The aortic valve is normal in structure. Aortic valve regurgitation is not visualized. No aortic stenosis is present.  Pulmonic Valve: The pulmonic valve was normal in structure. Pulmonic valve regurgitation is not visualized. No evidence of pulmonic stenosis.  Aorta: The aortic root is normal in size and structure.  Venous: The inferior vena cava  is normal in size with greater than 50% respiratory variability, suggesting right atrial pressure of 3 mmHg.  IAS/Shunts: No atrial level shunt detected by color flow Doppler.   LEFT VENTRICLE PLAX 2D LVIDd:         4.60 cm   Diastology LVIDs:         3.90 cm   LV e' medial:    5.00 cm/s LV PW:         1.30 cm   LV E/e' medial:  8.4 LV IVS:        1.30 cm   LV e' lateral:   5.55 cm/s LVOT diam:     2.10 cm   LV E/e' lateral: 7.5 LV SV:         73 LV SV Index:   37 LVOT Area:     3.46 cm   RIGHT VENTRICLE            IVC RV Basal diam:  2.40 cm    IVC diam: 1.50 cm RV S prime:     9.79 cm/s TAPSE (M-mode): 1.6 cm  LEFT ATRIUM             Index        RIGHT ATRIUM           Index LA diam:        3.30 cm 1.67 cm/m   RA Area:     12.60 cm LA Vol (A2C):   42.6 ml 21.59 ml/m  RA Volume:   26.40 ml  13.38 ml/m LA Vol (A4C):   35.8 ml 18.14 ml/m LA Biplane Vol: 38.6 ml 19.56 ml/m AORTIC VALVE LVOT Vmax:   118.00 cm/s LVOT Vmean:  89.500 cm/s LVOT VTI:    0.211 m  AORTA Ao Root diam: 3.80 cm Ao Asc diam:  3.30 cm Ao Desc diam: 2.10 cm  MITRAL VALVE MV Area (PHT): 3.51 cm     SHUNTS MV Decel Time: 216 msec     Systemic VTI:  0.21 m MV E velocity: 41.80 cm/s   Systemic Diam: 2.10 cm MV A velocity: 105.00 cm/s MV E/A ratio:  0.40  Gypsy Balsam MD Electronically signed by Gypsy Balsam MD Signature Date/Time: 01/09/2022/2:25:14 PM    Final    MONITORS  LONG TERM MONITOR (3-14 DAYS) 03/31/2018  Narrative A 3-day ZIO monitor was used starting 03/26/2018 to assess for ventricular arrhythmia in the setting of dilated cardiomyopathy.  The rhythm throughout is sinus with minimum average and maximum heart rates of 6085 and 116 bpm.  There were no bradycardic events no pauses of 3 seconds or greater and no episodes of sinus node or AV block.  Rare supraventricular arrhythmia was seen with APCs and no  episodes of SVT or atrial fibrillation  Occasional ventricular  arrhythmias present 2.2% ectopic burden.  There were 8062 PVCs 1485 couplets 41 triplets and episodes of bigeminy and trigeminy.  There are no episodes of ventricular tachycardia.  There is a triggered events associated with sinus rhythm  Conclusion occasional ventricular ectopy, no episodes of ventricular tachycardia   CT SCANS  CT CORONARY MORPH W/CTA COR W/SCORE 05/18/2021  Addendum 05/18/2021  2:34 PM ADDENDUM REPORT: 05/18/2021 14:32  CLINICAL DATA:  Chest pain  EXAM: Cardiac CTA  MEDICATIONS: Sub lingual nitro. 4 mg and Coreg 6.25 mg  TECHNIQUE: The patient was scanned on a [employer] 192 scanner. Gantry rotation speed was 250 msecs. Collimation was. 6 mm . A 120 kV prospective scan was triggered in the ascending thoracic aorta at 140 HU's with full mA between 30-70% of the R-R interval . Average HR during the scan was 63 bpm. The 3D data set was interpreted on a dedicated work station using MPR, MIP and VRT modes. A total of 80 cc of contrast was used.  FINDINGS: Non-cardiac: See separate report from [facility]. No significant findings on limited lung and soft tissue windows.  Calcium score: Severe 3 vessel coronary calcification  LM: 0  LAD: 2712  LCX: 1064  RCA: 1194  Total: 4969  Coronary Arteries: Right dominant with no anomalies  LM: Normal  LAD: 25-49% calcified proximal and mid stenosis 50-69% calcified distal stenosis Mid LAD is aneurysmal  D1: Small normal  D2: Small normal  D3: Small 50-69% calcified stenosis  Circumflex: 25-49% calcified plaque proximally / mid  OM1: Small normal  OM2: 25-49% calcified proximal stenosis  AV Groove: Normal  RCA: 1-24% proximal stenosis, 25-49% mid vessel stenosis  PDA: 25-49% stenosis  PLA: 1-24% calcified stenosis  IMPRESSION: 1. Calcium score 4969 severe 3 vessel  2. CAD RADS 3 possibly obstructive CAD Tightest lesions appear to be in distal LAD and small D3  3.  Normal  ascending thoracic aorta 3.4 cm  4. Study difficult to interpret due to extremely high calcium score sent for [facility]  Charlton Haws   Electronically Signed By: Charlton Haws M.D. On: 05/18/2021 14:32  Narrative EXAM: OVER-READ INTERPRETATION  CT CHEST  The following report is an over-read performed by radiologist Dr. Trudie Reed of [facility] on 05/18/2021. This over-read does not include interpretation of cardiac or coronary anatomy or pathology. The coronary calcium score/coronary CTA interpretation by the cardiologist is attached.  COMPARISON:  None.  FINDINGS: Atherosclerotic calcifications are noted in the thoracic aorta. Within the visualized portions of the thorax there are no suspicious appearing pulmonary nodules or masses, there is no acute consolidative airspace disease, no pleural effusions, no pneumothorax and no lymphadenopathy. Visualized portions of the upper abdomen are unremarkable. There are no aggressive appearing lytic or blastic lesions noted in the visualized portions of the skeleton.  IMPRESSION: 1.  Aortic Atherosclerosis (ICD10-I70.0).  Electronically Signed: By: Trudie Reed M.D. On: 05/18/2021 12:07              Recent Labs: 06/26/2022: ALT 14; BUN 17; Creatinine, Ser 1.50; Hemoglobin 13.9; NT-Pro BNP 151; Platelets 272; Potassium 4.9; Sodium 137  Recent Lipid Panel    Component Value Date/Time   CHOL 131 06/26/2022 1311   TRIG 377 (H) 06/26/2022 1311   HDL 31 (L) 06/26/2022 1311   CHOLHDL 4.2 06/26/2022 1311   LDLCALC 44 06/26/2022 1311    Physical Exam:    VS:  There  were no vitals taken for this visit.    Wt Readings from Last 3 Encounters:  06/26/22 184 lb (83.5 kg)  12/27/21 172 lb 12.8 oz (78.4 kg)  06/29/21 172 lb (78 kg)     GEN: *** Well nourished, well developed in no acute distress HEENT: Normal NECK: No JVD; No carotid bruits LYMPHATICS: No lymphadenopathy CARDIAC: ***RRR, no murmurs, rubs,  gallops RESPIRATORY:  Clear to auscultation without rales, wheezing or rhonchi  ABDOMEN: Soft, non-tender, non-distended MUSCULOSKELETAL:  No edema; No deformity  SKIN: Warm and dry NEUROLOGIC:  Alert and oriented x 3 PSYCHIATRIC:  Normal affect    Signed, David Herrlich, MD  01/13/2023 5:05 PM    [facility]

## 2023-01-14 ENCOUNTER — Ambulatory Visit: Payer: Medicare HMO | Admitting: Cardiology

## 2023-01-14 DIAGNOSIS — E782 Mixed hyperlipidemia: Secondary | ICD-10-CM

## 2023-01-14 DIAGNOSIS — N182 Chronic kidney disease, stage 2 (mild): Secondary | ICD-10-CM

## 2023-01-14 DIAGNOSIS — I5022 Chronic systolic (congestive) heart failure: Secondary | ICD-10-CM

## 2023-01-14 DIAGNOSIS — I493 Ventricular premature depolarization: Secondary | ICD-10-CM

## 2023-01-14 DIAGNOSIS — I420 Dilated cardiomyopathy: Secondary | ICD-10-CM

## 2023-01-14 DIAGNOSIS — I25118 Atherosclerotic heart disease of native coronary artery with other forms of angina pectoris: Secondary | ICD-10-CM

## 2023-02-12 LAB — LAB REPORT - SCANNED: EGFR: 59

## 2023-03-13 NOTE — Progress Notes (Unsigned)
fraction is severely decreased (<30%).  This is an intermediate risk study.  No evidence of ischemia on this study.   ECHOCARDIOGRAM  ECHOCARDIOGRAM COMPLETE 01/09/2022  Narrative ECHOCARDIOGRAM REPORT    Patient Name:   David Hester Date of Exam: 01/09/2022 Medical Rec #:  [id number]             Height:       70.6 in Accession #:    [id number]            Weight:       172.8 lb Date of Birth:  1946/04/30             BSA:          1.974 m Patient Age:    77 years              BP:           119/63 mmHg Patient Gender: M                     HR:           82 bpm. 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 CHF, Previous Myocardial Infarction and CAD; Risk Factors:Hypertension and Dyslipidemia.  Sonographer:    Margreta Journey RDCS Referring Phys: [id number] Andreina Outten J Precious Segall  IMPRESSIONS   1. Left ventricular ejection fraction, by estimation, is 35 to 40%. The left ventricle has moderately decreased function. The left ventricle has no regional wall motion abnormalities. There is mild left ventricular hypertrophy. Left ventricular diastolic parameters are consistent with Grade I diastolic dysfunction (impaired relaxation). 2. Right ventricular systolic function is normal. The right ventricular size is normal. 3. The mitral valve is normal in structure. No evidence of mitral valve regurgitation. No evidence of mitral stenosis. 4. The aortic valve is normal in structure. Aortic valve regurgitation is not visualized. No aortic stenosis is present. 5. The inferior vena cava is normal in size with greater than 50% respiratory variability, suggesting right atrial pressure of 3 mmHg.  FINDINGS Left Ventricle: Left ventricular ejection fraction, by estimation, is 35 to 40%. The left ventricle has moderately decreased function. The left ventricle has no regional wall motion abnormalities. The left ventricular internal cavity size was normal in size. There is mild left ventricular hypertrophy. Left ventricular diastolic parameters are consistent with Grade I diastolic dysfunction (impaired relaxation).  Right Ventricle: The right ventricular size is normal. No increase in right ventricular wall thickness. Right ventricular systolic function is normal.  Left Atrium: Left atrial size  was normal in size.  Right Atrium: Right atrial size was normal in size.  Pericardium: There is no evidence of pericardial effusion.  Mitral Valve: The mitral valve is normal in structure. No evidence of mitral valve regurgitation. No evidence of mitral valve stenosis.  Tricuspid Valve: The tricuspid valve is normal in structure. Tricuspid valve regurgitation is not demonstrated. No evidence of tricuspid stenosis.  Aortic Valve: The aortic valve is normal in structure. Aortic valve regurgitation is not visualized. No aortic stenosis is present.  fraction is severely decreased (<30%).  This is an intermediate risk study.  No evidence of ischemia on this study.   ECHOCARDIOGRAM  ECHOCARDIOGRAM COMPLETE 01/09/2022  Narrative ECHOCARDIOGRAM REPORT    Patient Name:   David Hester Date of Exam: 01/09/2022 Medical Rec #:  [id number]             Height:       70.6 in Accession #:    [id number]            Weight:       172.8 lb Date of Birth:  1946/04/30             BSA:          1.974 m Patient Age:    77 years              BP:           119/63 mmHg Patient Gender: M                     HR:           82 bpm. 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 CHF, Previous Myocardial Infarction and CAD; Risk Factors:Hypertension and Dyslipidemia.  Sonographer:    Margreta Journey RDCS Referring Phys: [id number] Andreina Outten J Precious Segall  IMPRESSIONS   1. Left ventricular ejection fraction, by estimation, is 35 to 40%. The left ventricle has moderately decreased function. The left ventricle has no regional wall motion abnormalities. There is mild left ventricular hypertrophy. Left ventricular diastolic parameters are consistent with Grade I diastolic dysfunction (impaired relaxation). 2. Right ventricular systolic function is normal. The right ventricular size is normal. 3. The mitral valve is normal in structure. No evidence of mitral valve regurgitation. No evidence of mitral stenosis. 4. The aortic valve is normal in structure. Aortic valve regurgitation is not visualized. No aortic stenosis is present. 5. The inferior vena cava is normal in size with greater than 50% respiratory variability, suggesting right atrial pressure of 3 mmHg.  FINDINGS Left Ventricle: Left ventricular ejection fraction, by estimation, is 35 to 40%. The left ventricle has moderately decreased function. The left ventricle has no regional wall motion abnormalities. The left ventricular internal cavity size was normal in size. There is mild left ventricular hypertrophy. Left ventricular diastolic parameters are consistent with Grade I diastolic dysfunction (impaired relaxation).  Right Ventricle: The right ventricular size is normal. No increase in right ventricular wall thickness. Right ventricular systolic function is normal.  Left Atrium: Left atrial size  was normal in size.  Right Atrium: Right atrial size was normal in size.  Pericardium: There is no evidence of pericardial effusion.  Mitral Valve: The mitral valve is normal in structure. No evidence of mitral valve regurgitation. No evidence of mitral valve stenosis.  Tricuspid Valve: The tricuspid valve is normal in structure. Tricuspid valve regurgitation is not demonstrated. No evidence of tricuspid stenosis.  Aortic Valve: The aortic valve is normal in structure. Aortic valve regurgitation is not visualized. No aortic stenosis is present.  fraction is severely decreased (<30%).  This is an intermediate risk study.  No evidence of ischemia on this study.   ECHOCARDIOGRAM  ECHOCARDIOGRAM COMPLETE 01/09/2022  Narrative ECHOCARDIOGRAM REPORT    Patient Name:   David Hester Date of Exam: 01/09/2022 Medical Rec #:  [id number]             Height:       70.6 in Accession #:    [id number]            Weight:       172.8 lb Date of Birth:  1946/04/30             BSA:          1.974 m Patient Age:    77 years              BP:           119/63 mmHg Patient Gender: M                     HR:           82 bpm. 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 CHF, Previous Myocardial Infarction and CAD; Risk Factors:Hypertension and Dyslipidemia.  Sonographer:    Margreta Journey RDCS Referring Phys: [id number] Andreina Outten J Precious Segall  IMPRESSIONS   1. Left ventricular ejection fraction, by estimation, is 35 to 40%. The left ventricle has moderately decreased function. The left ventricle has no regional wall motion abnormalities. There is mild left ventricular hypertrophy. Left ventricular diastolic parameters are consistent with Grade I diastolic dysfunction (impaired relaxation). 2. Right ventricular systolic function is normal. The right ventricular size is normal. 3. The mitral valve is normal in structure. No evidence of mitral valve regurgitation. No evidence of mitral stenosis. 4. The aortic valve is normal in structure. Aortic valve regurgitation is not visualized. No aortic stenosis is present. 5. The inferior vena cava is normal in size with greater than 50% respiratory variability, suggesting right atrial pressure of 3 mmHg.  FINDINGS Left Ventricle: Left ventricular ejection fraction, by estimation, is 35 to 40%. The left ventricle has moderately decreased function. The left ventricle has no regional wall motion abnormalities. The left ventricular internal cavity size was normal in size. There is mild left ventricular hypertrophy. Left ventricular diastolic parameters are consistent with Grade I diastolic dysfunction (impaired relaxation).  Right Ventricle: The right ventricular size is normal. No increase in right ventricular wall thickness. Right ventricular systolic function is normal.  Left Atrium: Left atrial size  was normal in size.  Right Atrium: Right atrial size was normal in size.  Pericardium: There is no evidence of pericardial effusion.  Mitral Valve: The mitral valve is normal in structure. No evidence of mitral valve regurgitation. No evidence of mitral valve stenosis.  Tricuspid Valve: The tricuspid valve is normal in structure. Tricuspid valve regurgitation is not demonstrated. No evidence of tricuspid stenosis.  Aortic Valve: The aortic valve is normal in structure. Aortic valve regurgitation is not visualized. No aortic stenosis is present.  fraction is severely decreased (<30%).  This is an intermediate risk study.  No evidence of ischemia on this study.   ECHOCARDIOGRAM  ECHOCARDIOGRAM COMPLETE 01/09/2022  Narrative ECHOCARDIOGRAM REPORT    Patient Name:   David Hester Date of Exam: 01/09/2022 Medical Rec #:  [id number]             Height:       70.6 in Accession #:    [id number]            Weight:       172.8 lb Date of Birth:  1946/04/30             BSA:          1.974 m Patient Age:    77 years              BP:           119/63 mmHg Patient Gender: M                     HR:           82 bpm. 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 CHF, Previous Myocardial Infarction and CAD; Risk Factors:Hypertension and Dyslipidemia.  Sonographer:    Margreta Journey RDCS Referring Phys: [id number] Andreina Outten J Precious Segall  IMPRESSIONS   1. Left ventricular ejection fraction, by estimation, is 35 to 40%. The left ventricle has moderately decreased function. The left ventricle has no regional wall motion abnormalities. There is mild left ventricular hypertrophy. Left ventricular diastolic parameters are consistent with Grade I diastolic dysfunction (impaired relaxation). 2. Right ventricular systolic function is normal. The right ventricular size is normal. 3. The mitral valve is normal in structure. No evidence of mitral valve regurgitation. No evidence of mitral stenosis. 4. The aortic valve is normal in structure. Aortic valve regurgitation is not visualized. No aortic stenosis is present. 5. The inferior vena cava is normal in size with greater than 50% respiratory variability, suggesting right atrial pressure of 3 mmHg.  FINDINGS Left Ventricle: Left ventricular ejection fraction, by estimation, is 35 to 40%. The left ventricle has moderately decreased function. The left ventricle has no regional wall motion abnormalities. The left ventricular internal cavity size was normal in size. There is mild left ventricular hypertrophy. Left ventricular diastolic parameters are consistent with Grade I diastolic dysfunction (impaired relaxation).  Right Ventricle: The right ventricular size is normal. No increase in right ventricular wall thickness. Right ventricular systolic function is normal.  Left Atrium: Left atrial size  was normal in size.  Right Atrium: Right atrial size was normal in size.  Pericardium: There is no evidence of pericardial effusion.  Mitral Valve: The mitral valve is normal in structure. No evidence of mitral valve regurgitation. No evidence of mitral valve stenosis.  Tricuspid Valve: The tricuspid valve is normal in structure. Tricuspid valve regurgitation is not demonstrated. No evidence of tricuspid stenosis.  Aortic Valve: The aortic valve is normal in structure. Aortic valve regurgitation is not visualized. No aortic stenosis is present.  Cardiology Office Note:    Date:  03/14/2023   ID:  [name], [place] 1945-10-01, MRN [id number]  PCP:  Selina Cooley, MD  Cardiologist:  Norman Herrlich, MD    Referring MD: Selina Cooley, MD    ASSESSMENT:    1. 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 CKD (chronic kidney disease), stage II   3. Ventricular premature depolarization   4. Coronary artery disease of native artery of native heart with stable angina pectoris (HCC)   5. Mixed hyperlipidemia    PLAN:    In order of problems listed above:  Presently compensated no fluid overload takes a minimum dose of the diuretic carvedilol organ to dose down on Entresto and avoid MRA.  Also would not put him on an SGLT2 inhibitor with his current GFR.  His wife will hold medications if necessary and will recheck echocardiogram to reassess his ejection fraction. Worsened now on the borderline of stage IV CKD Stable he will continue low-dose beta-blocker Stable having no anginal discomfort he is on good foundational medical therapy including aspirin and his statin   Next appointment: 3 months   Medication Adjustments/Labs and Tests Ordered: Current medicines are reviewed at length with the patient today.  Concerns regarding medicines are outlined above.  No orders of the defined types were placed in this encounter.  No orders of the defined types were placed in this encounter.    History of Present Illness:    David Hester is a 77 y.o. 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 He is not the same his endurance is diminished  sometimes when he does outside work he will be hypotensive and weak and his renal function has deteriorated. We are going to dose adjust his Sherryll Burger down to first dose 2426 his wife and RN if necessary and we will recheck his echocardiogram to see if this is a signal of deteriorating systolic function Despite this he is not having edema shortness of breath chest pain or syncope And his wife ask about him taking spironolactone advised and I would not do it with his deteriorating renal function Past Medical History:  Diagnosis Date   Alzheimer's disease (HCC) 07/06/2021   Arthritis of carpometacarpal [facility]) joint of right thumb 12/13/2020   Benign hypertension with CKD (chronic kidney disease) stage III (HCC) 03/09/2015   Bilateral wrist pain 07/11/2015   CAD (coronary artery disease), native coronary artery 03/09/2015   single vessel 60 percent PDA stenosis by previous catheterization. single vessel 60 percent PDA stenosis by previous catheterization.  Formatting of this note might be different from the original. single vessel 60 percent PDA stenosis by previous catheterization. Formatting of this note might be different from the original. 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 CKD (chronic kidney disease), stage II   3. Ventricular premature depolarization   4. Coronary artery disease of native artery of native heart with stable angina pectoris (HCC)   5. Mixed hyperlipidemia    PLAN:    In order of problems listed above:  Presently compensated no fluid overload takes a minimum dose of the diuretic carvedilol organ to dose down on Entresto and avoid MRA.  Also would not put him on an SGLT2 inhibitor with his current GFR.  His wife will hold medications if necessary and will recheck echocardiogram to reassess his ejection fraction. Worsened now on the borderline of stage IV CKD Stable he will continue low-dose beta-blocker Stable having no anginal discomfort he is on good foundational medical therapy including aspirin and his statin   Next appointment: 3 months   Medication Adjustments/Labs and Tests Ordered: Current medicines are reviewed at length with the patient today.  Concerns regarding medicines are outlined above.  No orders of the defined types were placed in this encounter.  No orders of the defined types were placed in this encounter.    History of Present Illness:    David Hester is a 77 y.o. 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 He is not the same his endurance is diminished  sometimes when he does outside work he will be hypotensive and weak and his renal function has deteriorated. We are going to dose adjust his Sherryll Burger down to first dose 2426 his wife and RN if necessary and we will recheck his echocardiogram to see if this is a signal of deteriorating systolic function Despite this he is not having edema shortness of breath chest pain or syncope And his wife ask about him taking spironolactone advised and I would not do it with his deteriorating renal function Past Medical History:  Diagnosis Date   Alzheimer's disease (HCC) 07/06/2021   Arthritis of carpometacarpal [facility]) joint of right thumb 12/13/2020   Benign hypertension with CKD (chronic kidney disease) stage III (HCC) 03/09/2015   Bilateral wrist pain 07/11/2015   CAD (coronary artery disease), native coronary artery 03/09/2015   single vessel 60 percent PDA stenosis by previous catheterization. single vessel 60 percent PDA stenosis by previous catheterization.  Formatting of this note might be different from the original. single vessel 60 percent PDA stenosis by previous catheterization. Formatting of this note might be different from the original. 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

## 2023-03-14 ENCOUNTER — Encounter: Payer: Self-pay | Admitting: Cardiology

## 2023-03-14 ENCOUNTER — Ambulatory Visit: Payer: Medicare HMO | Attending: Cardiology | Admitting: Cardiology

## 2023-03-14 VITALS — BP 118/60 | HR 67 | Ht 70.0 in | Wt 178.8 lb

## 2023-03-14 DIAGNOSIS — I493 Ventricular premature depolarization: Secondary | ICD-10-CM | POA: Diagnosis not present

## 2023-03-14 DIAGNOSIS — I25118 Atherosclerotic heart disease of native coronary artery with other forms of angina pectoris: Secondary | ICD-10-CM

## 2023-03-14 DIAGNOSIS — N182 Chronic kidney disease, stage 2 (mild): Secondary | ICD-10-CM

## 2023-03-14 DIAGNOSIS — I110 Hypertensive heart disease with heart failure: Secondary | ICD-10-CM | POA: Diagnosis not present

## 2023-03-14 DIAGNOSIS — I5022 Chronic systolic (congestive) heart failure: Secondary | ICD-10-CM

## 2023-03-14 DIAGNOSIS — E782 Mixed hyperlipidemia: Secondary | ICD-10-CM

## 2023-03-14 MED ORDER — ENTRESTO 24-26 MG PO TABS: 1.0000 | ORAL_TABLET | Freq: Two times a day (BID) | ORAL | 3 refills | Status: DC

## 2023-03-14 NOTE — Patient Instructions (Signed)
Medication Instructions:  Your physician has recommended you make the following change in your medication:   START: Entresto 24-26 two times daily  *If you need a refill on your cardiac medications before your next appointment, please call your pharmacy*   Lab Work: None If you have labs (blood work) drawn today and your tests are completely normal, you will receive your results only by: MyChart Message (if you have MyChart) OR A paper copy in the mail If you have any lab test that is abnormal or we need to change your treatment, we will call you to review the results.   Testing/Procedures: Your physician has requested that you have an echocardiogram. Echocardiography is a painless test that uses sound waves to create images of your heart. It provides your doctor with information about the size and shape of your heart and how well your heart's chambers and valves are working. This procedure takes approximately one hour. There are no restrictions for this procedure. Please do NOT wear cologne, perfume, aftershave, or lotions (deodorant is allowed). Please arrive 15 minutes prior to your appointment time.    Follow-Up: At [facility], you and your health needs are our priority.  As part of our continuing mission to provide you with exceptional heart care, we have created designated Provider Care Teams.  These Care Teams include your primary Cardiologist (physician) and Advanced Practice Providers (APPs -  Physician Assistants and Nurse Practitioners) who all work together to provide you with the care you need, when you need it.  We recommend signing up for the patient portal called "MyChart".  Sign up information is provided on this After Visit Summary.  MyChart is used to connect with patients for Virtual Visits (Telemedicine).  Patients are able to view lab/test results, encounter notes, upcoming appointments, etc.  Non-urgent messages can be sent to your provider as well.   To  learn more about what you can do with MyChart, go to [email or web address].    Your next appointment:   3 month(s)  Provider:   Norman Herrlich, MD    Other Instructions None

## 2023-04-24 ENCOUNTER — Ambulatory Visit: Payer: Medicare HMO | Attending: Cardiology

## 2023-04-24 ENCOUNTER — Encounter: Payer: Self-pay | Admitting: Cardiology

## 2023-04-24 DIAGNOSIS — I493 Ventricular premature depolarization: Secondary | ICD-10-CM

## 2023-04-24 DIAGNOSIS — I110 Hypertensive heart disease with heart failure: Secondary | ICD-10-CM

## 2023-04-24 DIAGNOSIS — I5022 Chronic systolic (congestive) heart failure: Secondary | ICD-10-CM

## 2023-04-24 DIAGNOSIS — E782 Mixed hyperlipidemia: Secondary | ICD-10-CM

## 2023-04-24 DIAGNOSIS — I25118 Atherosclerotic heart disease of native coronary artery with other forms of angina pectoris: Secondary | ICD-10-CM

## 2023-04-24 DIAGNOSIS — N182 Chronic kidney disease, stage 2 (mild): Secondary | ICD-10-CM

## 2023-04-24 LAB — ECHOCARDIOGRAM COMPLETE
S' Lateral: 4.7 cm
Single Plane A4C EF: 37.3 %

## 2023-05-10 ENCOUNTER — Other Ambulatory Visit: Payer: Self-pay | Admitting: Cardiology

## 2023-06-11 ENCOUNTER — Encounter: Payer: Self-pay | Admitting: Cardiology

## 2023-06-14 ENCOUNTER — Encounter: Payer: Self-pay | Admitting: Cardiology

## 2023-06-14 NOTE — Progress Notes (Unsigned)
I spoke with his wife last evening when I saw the results of his labs that were drawn at his PCP office on Monday. There is a marked decrease in his GFR labs were done because she was concerned that he may have a urinary tract infection he had increased confusion and some incontinence there been a couple episodes of systolic blood pressure less than 100. With reduced GFR now stage IV I asked her to hold his diuretic discontinue Entresto and reduce his carvedilol to once daily. She was also concerned he has a history of renal calculi that he could have obstructive uropathy and she will contact her PCP office. She received a phone call from that practice that he is being referred to nephrology. She tells me he has an appointment to be seen in my office next week.

## 2023-06-17 NOTE — Progress Notes (Signed)
 Cardiology Office Note:    Date:  06/20/2023   ID:  David Hester, [place] [birth year], MRN [id number]  PCP:  Xenia Devonshire, MD  Cardiologist:  Redell Leiter, MD    Referring MD: Xenia Devonshire, MD    ASSESSMENT:    1. CKD (chronic kidney disease) stage 4, GFR 15-29 ml/min (HCC)   2. 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 Coronary artery disease of native artery of native heart with stable angina pectoris (HCC)   4. Ventricular premature depolarization   5. 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 Ezzard at her request   Next appointment: 6 weeks   Medication Adjustments/Labs and Tests Ordered: Current medicines are reviewed at length with the patient today.  Concerns regarding medicines are outlined above.  No orders of the defined types were placed in this encounter.  No orders of the defined types were placed in this encounter.    History of Present Illness:    David Hester is a 77 y.o. male with a hx of chemotherapy related cardiomyopathy with a history of non-Hodgkin's lymphoma CAD PVCs hyperlipidemia most recent ejection fraction 35 to 40% and cognitive dysfunction last seen 03/14/2023.  I interacted with him by phone after I saw the results of his PCP office and a patient request message sent to [facility] 06/14/2023.  I spoke with  his wife last evening when I saw the results of his labs that were drawn at his PCP office on Monday.  There is a marked decrease in his GFR labs were done because she was concerned that he may have a urinary tract infection he had increased confusion and some incontinence there been a couple episodes of systolic blood pressure less than 100.  With reduced GFR now stage IV I asked her to hold his diuretic discontinue Entresto  and reduce his carvedilol  to once daily.  She was also concerned he has a history of renal calculi that he could have obstructive uropathy and she will contact her PCP office.  She received a phone call from that practice that he is being referred to nephrology.  She tells me he has an appointment to be seen in my office next week.  Compliance with diet, lifestyle and medications: Yes  He presents back today with his wife she is seen the results of the CT scan that was performed.  On 1 occasion his blood pressure is elevated he has a little bit of edema he is not short of breath no orthopnea chest pain palpitation or syncope In the last month he is much weaker and he continues to lose weight  He had the CT performed renal calculus profile showing the presence of right hydronephrosis likely related to adenopathy and findings of marked abnormal thickening small bowel loops and mesenteric and retroperitoneal adenopathy concerning for lymphoma involvement  His wife is a bit lost where  to go.  I told her I sent a copy of my note to her oncologist. Past Medical History:  Diagnosis Date   Alzheimer's disease (HCC) 07/06/2021   Arthritis of carpometacarpal [facility]) joint of right thumb 12/13/2020   Benign hypertension with CKD (chronic kidney disease) stage III (HCC) 03/09/2015   Bilateral wrist pain 07/11/2015   CAD (coronary artery disease), native coronary artery 03/09/2015   single vessel 60 percent PDA stenosis by previous catheterization. single vessel 60 percent PDA stenosis by previous  catheterization.  Formatting of this note might be different from the original. single vessel 60 percent PDA stenosis by previous catheterization. Formatting of this note might be different from the original. 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 With traumatic toe amputation and neuropathy right foot.   Glucosuria 08/20/2018   High cholesterol    History of B-cell lymphoma 07/11/2015   History of small bowel obstruction 03/30/2015   Hyperglycemia 03/30/2015   Hyperlipidemia 03/09/2015   Hypertension    Hypertensive heart disease 03/09/2015   Insomnia 07/11/2015   Lumbar radiculopathy 05/31/2014   Lumbar spondylosis 05/31/2014   Medicare annual wellness visit, subsequent 03/29/2016   MI (myocardial infarction) (HCC)    Moderate episode of recurrent major depressive disorder (HCC) 03/29/2016   Neurogenic claudication 06/30/2019   Old MI (myocardial infarction) 01/03/2021   Personal history of cerebral atherosclerosis 06/25/2017   Small bowel obstruction (HCC) 03/30/2015   Spinal stenosis, 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   Stage 3a chronic kidney disease (HCC) 09/27/2020   Sundowning 05/22/2021   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   allopurinol (ZYLOPRIM) 100 MG tablet Take 100 mg by mouth daily.   ammonium lactate (LAC-HYDRIN) 12 % lotion Apply 1 application topically as needed for dry skin.   aspirin 81 MG tablet Take 81 mg by mouth daily.   carvedilol  (COREG ) 6.25 MG tablet Take 6.25 mg by mouth daily.   colchicine 0.6 MG tablet Take 0.6 mg by mouth as needed (gout).   escitalopram (LEXAPRO) 10 MG tablet Take 10 mg by mouth daily.   famotidine (PEPCID) 20 MG tablet Take 20 mg by mouth 2 (two) times daily.   ibuprofen (ADVIL,MOTRIN) 200 MG tablet Take 200 mg by mouth every 6 (six) hours as needed for mild pain.   latanoprost (XALATAN) 0.005 % ophthalmic solution Place 1 drop into both eyes at bedtime.   magnesium oxide (MAG-OX) 400 MG tablet Take 1 tablet by mouth daily.   memantine (NAMENDA) 5 MG tablet Take 5 mg by mouth every morning.   MULTIPLE VITAMIN PO Take 1 tablet by mouth daily.   mupirocin ointment (BACTROBAN) 2 % Apply 1 Application topically 2 (two) times daily.   nitroGLYCERIN  (NITROSTAT ) 0.4 MG SL tablet Place  1 tablet (0.4 mg total) under the tongue every 5 (five) minutes as needed for chest pain.   Omega-3 Fatty Acids (FISH OIL ) 1000 MG CAPS Take 1 capsule (1,000 mg total) by mouth daily.   rosuvastatin  (CRESTOR ) 10 MG tablet TAKE 1 TABLET BY MOUTH EVERY DAY   sildenafil (REVATIO) 20 MG tablet Take 20 mg by mouth daily as needed (erectile dysfunction). 1 - 5 tablets as needed   testosterone cypionate (DEPOTESTOSTERONE CYPIONATE) 200 MG/ML injection Inject 1.5 mLs into the muscle every 21 ( twenty-one) days.   traMADol (ULTRAM) 50 MG tablet Take 50 mg by mouth every 8 (eight) hours as needed. for pain   triamcinolone cream (KENALOG) 0.1 % Apply 1 Application topically 3 (three) times daily.   Wheat Dextrin (BENEFIBER DRINK MIX PO) Take 1 packet by mouth 2 (two) times daily.    zolpidem (AMBIEN) 10 MG tablet Take 10 mg by mouth at bedtime as needed for  sleep.       EKGs/Labs/Other Studies Reviewed:    The following studies were reviewed today:  Cardiac Studies & Procedures     STRESS TESTS  MYOCARDIAL PERFUSION IMAGING 03/11/2018  Narrative  Nuclear stress EF: 28%.  The left ventricular ejection fraction is severely decreased (<30%).  This is an intermediate risk study.  No evidence of ischemia on this study.  ECHOCARDIOGRAM  ECHOCARDIOGRAM COMPLETE 04/24/2023  Narrative ECHOCARDIOGRAM REPORT    Patient Name:   MAHESH SIZEMORE Date of Exam: 04/24/2023 Medical Rec #:  [id number]             Height:       70.0 in Accession #:    [id number]            Weight:       178.8 lb Date of Birth:  Oct 25, 1945             BSA:          1.990 m Patient Age:    24 years              BP:           118/60 mmHg Patient Gender: M                     HR:           76 bpm. 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  Sonographer:    Lynwood Silvas RDCS Referring Phys: [id number] Joyleen Haselton J Danni Leabo  IMPRESSIONS   1. Left ventricular ejection fraction, by estimation, is 40 to 45%. The left ventricle has mildly decreased function. The left ventricle has no regional wall motion abnormalities. Left ventricular diastolic parameters are consistent with Grade I diastolic dysfunction (impaired relaxation). 2. Right ventricular systolic function is normal. The right ventricular size is normal. There is  normal pulmonary artery systolic pressure. 3. The mitral valve is normal in structure. Mild mitral valve regurgitation. No evidence of mitral stenosis. 4. The aortic valve is normal in structure. Aortic valve regurgitation is not visualized.  No aortic stenosis is present. 5. The inferior vena cava is normal in size with greater than 50% respiratory variability, suggesting right atrial pressure of 3 mmHg.  FINDINGS Left Ventricle: Left ventricular ejection fraction, by estimation, is 40 to 45%. The left ventricle has mildly decreased function. The left ventricle has no regional wall motion abnormalities. The left ventricular internal cavity size was normal in size. There is no left ventricular hypertrophy. Left ventricular diastolic parameters are consistent with Grade I diastolic dysfunction (impaired relaxation).  Right Ventricle: The right ventricular size is normal. No increase in right ventricular wall thickness. Right ventricular systolic function is normal. There is normal pulmonary artery systolic pressure. The tricuspid regurgitant velocity is 2.35 m/s, and with an assumed right atrial pressure of 3 mmHg, the estimated right ventricular systolic pressure is 25.1 mmHg.  Left Atrium: Left atrial size was normal in size.  Right Atrium: Right atrial size was normal in size.  Pericardium: There is no evidence of pericardial effusion.  Mitral Valve: The mitral valve is normal in structure. Mild mitral valve regurgitation. No evidence of mitral valve stenosis.  Tricuspid Valve: The tricuspid valve is normal in structure. Tricuspid valve regurgitation is mild . No evidence of tricuspid stenosis.  Aortic Valve: The aortic valve is normal in structure. Aortic valve regurgitation is not visualized. No aortic stenosis is present.  Pulmonic Valve: The pulmonic valve was normal in structure. Pulmonic valve regurgitation is not visualized. No evidence of pulmonic stenosis.  Aorta: The aortic root  is normal in size and structure.  Venous: The inferior vena cava is normal in size with greater than 50% respiratory variability, suggesting right atrial pressure of 3 mmHg.  IAS/Shunts: No atrial level shunt detected by color flow Doppler.   LEFT VENTRICLE PLAX 2D LVIDd:         5.60 cm     Diastology LVIDs:         4.70 cm     LV e' medial:    5.87 cm/s LV PW:         1.00 cm     LV E/e' medial:  11.9 LV IVS:        1.10 cm     LV e' lateral:   9.03 cm/s LVOT diam:     2.10 cm     LV E/e' lateral: 7.7 LV SV:         58 LV SV Index:   29 LVOT Area:     3.46 cm  LV Volumes (MOD) LV vol d, MOD A4C: 85.8 ml LV vol s, MOD A4C: 53.8 ml LV SV MOD A4C:     85.8 ml  RIGHT VENTRICLE             IVC RV Basal diam:  3.10 cm     IVC diam: 1.90 cm RV S prime:     12.90 cm/s TAPSE (M-mode): 2.1 cm  LEFT ATRIUM             Index        RIGHT ATRIUM           Index LA diam:        3.90 cm 1.96 cm/m   RA Area:     15.00 cm LA Vol (A2C):   74.8 ml 37.58 ml/m  RA Volume:   32.30 ml  16.23 ml/m LA Vol (A4C):   49.3 ml 24.77 ml/m LA Biplane Vol: 62.8 ml 31.55 ml/m AORTIC VALVE LVOT Vmax:   78.85  cm/s LVOT Vmean:  51.600 cm/s LVOT VTI:    0.168 m  AORTA Ao Root diam: 3.50 cm Ao Asc diam:  3.30 cm Ao Desc diam: 2.65 cm  MV E velocity: 69.80 cm/s   TRICUSPID VALVE MV A velocity: 111.00 cm/s  TR Peak grad:   22.1 mmHg MV E/A ratio:  0.63         TR Vmax:        235.00 cm/s  SHUNTS Systemic VTI:  0.17 m Systemic Diam: 2.10 cm  Jennifer Crape MD Electronically signed by Jennifer Crape MD Signature Date/Time: 04/24/2023/1:20:27 PM    Final   MONITORS  LONG TERM MONITOR (3-14 DAYS) 03/31/2018  Narrative A 3-day ZIO monitor was used starting 03/26/2018 to assess for ventricular arrhythmia in the setting of dilated cardiomyopathy.  The rhythm throughout is sinus with minimum average and maximum heart rates of 6085 and 116 bpm.  There were no bradycardic events no pauses of 3  seconds or greater and no episodes of sinus node or AV block.  Rare supraventricular arrhythmia was seen with APCs and no episodes of SVT or atrial fibrillation  Occasional ventricular arrhythmias present 2.2% ectopic burden.  There were 8062 PVCs 1485 couplets 41 triplets and episodes of bigeminy and trigeminy.  There are no episodes of ventricular tachycardia.  There is a triggered events associated with sinus rhythm  Conclusion occasional ventricular ectopy, no episodes of ventricular tachycardia  CT SCANS  CT CORONARY FRACTIONAL FLOW RESERVE DATA PREP 05/18/2021  Narrative CLINICAL DATA:  CAD  EXAM: FFR CT  TECHNIQUE: The best systolic and diastolic phases of the patients gated cardiac CTA sent to [facility] for hemodynamic analysis  FINDINGS: FFR CT negative  RCA: 0.95  LAD: 0.93 D3 0.89  Circumflex: 0.99  IMPRESSION: Negative FFR CT with no flow limiting lesions modeled  Maude Emmer   Electronically Signed By: Maude Emmer M.D. On: 05/18/2021 17:50   CT CORONARY MORPH W/CTA COR W/SCORE 05/18/2021  Addendum 05/18/2021  2:34 PM ADDENDUM REPORT: 05/18/2021 14:32  CLINICAL DATA:  Chest pain  EXAM: Cardiac CTA  MEDICATIONS: Sub lingual nitro. 4 mg and Coreg  6.25 mg  TECHNIQUE: The patient was scanned on a [employer] 192 scanner. Gantry rotation speed was 250 msecs. Collimation was. 6 mm . A 120 kV prospective scan was triggered in the ascending thoracic aorta at 140 HU's with full mA between 30-70% of the R-R interval . Average HR during the scan was 63 bpm. The 3D data set was interpreted on a dedicated work station using MPR, MIP and VRT modes. A total of 80 cc of contrast was used.  FINDINGS: Non-cardiac: See separate report from [facility]. No significant findings on limited lung and soft tissue windows.  Calcium  score: Severe 3 vessel coronary calcification  LM: 0  LAD: 2712  LCX: 1064  RCA: 1194  Total: 4969  Coronary  Arteries: Right dominant with no anomalies  LM: Normal  LAD: 25-49% calcified proximal and mid stenosis 50-69% calcified distal stenosis Mid LAD is aneurysmal  D1: Small normal  D2: Small normal  D3: Small 50-69% calcified stenosis  Circumflex: 25-49% calcified plaque proximally / mid  OM1: Small normal  OM2: 25-49% calcified proximal stenosis  AV Groove: Normal  RCA: 1-24% proximal stenosis, 25-49% mid vessel stenosis  PDA: 25-49% stenosis  PLA: 1-24% calcified stenosis  IMPRESSION: 1. Calcium  score 4969 severe 3 vessel  2. CAD RADS 3 possibly obstructive CAD Tightest lesions appear to be in distal  LAD and small D3  3.  Normal ascending thoracic aorta 3.4 cm  4. Study difficult to interpret due to extremely high calcium  score sent for [facility]  Maude Emmer   Electronically Signed By: Maude Emmer M.D. On: 05/18/2021 14:32  Narrative EXAM: OVER-READ INTERPRETATION  CT CHEST  The following report is an over-read performed by radiologist Dr. Toribio Aye of [facility] on 05/18/2021. This over-read does not include interpretation of cardiac or coronary anatomy or pathology. The coronary calcium  score/coronary CTA interpretation by the cardiologist is attached.  COMPARISON:  None.  FINDINGS: Atherosclerotic calcifications are noted in the thoracic aorta. Within the visualized portions of the thorax there are no suspicious appearing pulmonary nodules or masses, there is no acute consolidative airspace disease, no pleural effusions, no pneumothorax and no lymphadenopathy. Visualized portions of the upper abdomen are unremarkable. There are no aggressive appearing lytic or blastic lesions noted in the visualized portions of the skeleton.  IMPRESSION: 1.  Aortic Atherosclerosis (ICD10-I70.0).  Electronically Signed: By: Toribio Aye M.D. On: 05/18/2021 12:07              Recent Labs: 06/26/2022: ALT 14; BUN 17; Creatinine, Ser  1.50; Hemoglobin 13.9; NT-Pro BNP 151; Platelets 272; Potassium 4.9; Sodium 137  Recent Lipid Panel    Component Value Date/Time   CHOL 131 06/26/2022 1311   TRIG 377 (H) 06/26/2022 1311   HDL 31 (L) 06/26/2022 1311   CHOLHDL 4.2 06/26/2022 1311   LDLCALC 44 06/26/2022 1311    Physical Exam:    VS:  BP 124/62   Pulse 90   Ht 5' 10 (1.778 m)   Wt 169 lb 6.4 oz (76.8 kg)   SpO2 99%   BMI 24.31 kg/m     Wt Readings from Last 3 Encounters:  06/20/23 169 lb 6.4 oz (76.8 kg)  03/14/23 178 lb 12.8 oz (81.1 kg)  06/26/22 184 lb (83.5 kg)     GEN: He looks frail well nourished, well developed in no acute distress HEENT: Normal NECK: No JVD; No carotid bruits LYMPHATICS: No lymphadenopathy CARDIAC: RRR, no murmurs, rubs, gallops RESPIRATORY:  Clear to auscultation without rales, wheezing or rhonchi  ABDOMEN: Soft, non-tender, non-distended MUSCULOSKELETAL: Small amount of pedal edema edema; No deformity  SKIN: Warm and dry NEUROLOGIC:  Alert and oriented x 3 PSYCHIATRIC:  Normal affect    Signed, Redell Leiter, MD  06/20/2023 11:29 AM    [facility]

## 2023-06-20 ENCOUNTER — Ambulatory Visit: Payer: Medicare HMO | Attending: Cardiology | Admitting: Cardiology

## 2023-06-20 ENCOUNTER — Telehealth: Payer: Self-pay | Admitting: Oncology

## 2023-06-20 ENCOUNTER — Encounter: Payer: Self-pay | Admitting: Cardiology

## 2023-06-20 VITALS — BP 124/62 | HR 90 | Ht 70.0 in | Wt 169.4 lb

## 2023-06-20 DIAGNOSIS — N184 Chronic kidney disease, stage 4 (severe): Secondary | ICD-10-CM

## 2023-06-20 DIAGNOSIS — I110 Hypertensive heart disease with heart failure: Secondary | ICD-10-CM | POA: Diagnosis not present

## 2023-06-20 DIAGNOSIS — I25118 Atherosclerotic heart disease of native coronary artery with other forms of angina pectoris: Secondary | ICD-10-CM | POA: Diagnosis not present

## 2023-06-20 DIAGNOSIS — I493 Ventricular premature depolarization: Secondary | ICD-10-CM | POA: Diagnosis not present

## 2023-06-20 DIAGNOSIS — I5022 Chronic systolic (congestive) heart failure: Secondary | ICD-10-CM

## 2023-06-20 DIAGNOSIS — E782 Mixed hyperlipidemia: Secondary | ICD-10-CM

## 2023-06-20 NOTE — Addendum Note (Signed)
 Addended by: Baldo Ash D on: 06/20/2023 11:37 AM   Modules accepted: Orders

## 2023-06-20 NOTE — Telephone Encounter (Signed)
 Contacted pt to schedule an appt to have him re-establish care with Dr. Melvyn Neth due to recent abnormal scan.    Unable to reach via phone, voicemail was left.

## 2023-06-20 NOTE — Patient Instructions (Signed)
 Medication Instructions:  Your physician recommends that you continue on your current medications as directed. Please refer to the Current Medication list given to you today.  *If you need a refill on your cardiac medications before your next appointment, please call your pharmacy*   Lab Work: BMP- today If you have labs (blood work) drawn today and your tests are completely normal, you will receive your results only by: MyChart Message (if you have MyChart) OR A paper copy in the mail If you have any lab test that is abnormal or we need to change your treatment, we will call you to review the results.   Testing/Procedures: None Ordered   Follow-Up: At [facility], you and your health needs are our priority.  As part of our continuing mission to provide you with exceptional heart care, we have created designated Provider Care Teams.  These Care Teams include your primary Cardiologist (physician) and Advanced Practice Providers (APPs -  Physician Assistants and Nurse Practitioners) who all work together to provide you with the care you need, when you need it.  We recommend signing up for the patient portal called MyChart.  Sign up information is provided on this After Visit Summary.  MyChart is used to connect with patients for Virtual Visits (Telemedicine).  Patients are able to view lab/test results, encounter notes, upcoming appointments, etc.  Non-urgent messages can be sent to your provider as well.   To learn more about what you can do with MyChart, go to [email or web address].    Your next appointment:   6 week(s)  The format for your next appointment:   In Person  Provider:   Redell Leiter, MD   Other Instructions NA

## 2023-06-20 NOTE — Telephone Encounter (Signed)
 06/20/23 Spoke with patients wife and confirmed next appt.

## 2023-06-21 LAB — BASIC METABOLIC PANEL
BUN/Creatinine Ratio: 11 (ref 10–24)
BUN: 41 mg/dL — ABNORMAL HIGH (ref 8–27)
CO2: 23 mmol/L (ref 20–29)
Calcium: 10.5 mg/dL — ABNORMAL HIGH (ref 8.6–10.2)
Chloride: 96 mmol/L (ref 96–106)
Creatinine, Ser: 3.84 mg/dL — ABNORMAL HIGH (ref 0.76–1.27)
Glucose: 115 mg/dL — ABNORMAL HIGH (ref 70–99)
Potassium: 5 mmol/L (ref 3.5–5.2)
Sodium: 134 mmol/L (ref 134–144)
eGFR: 15 mL/min/{1.73_m2} — ABNORMAL LOW (ref >59)

## 2023-07-03 ENCOUNTER — Ambulatory Visit: Payer: Medicare HMO | Admitting: Oncology

## 2023-07-03 ENCOUNTER — Other Ambulatory Visit: Payer: Medicare HMO

## 2023-07-18 ENCOUNTER — Telehealth: Payer: Self-pay | Admitting: Cardiology

## 2023-07-18 NOTE — Telephone Encounter (Signed)
Wife Dewayne Hatch) called to let Dr. Dulce Sellar know patient has been placed in hospice comfort care.  Wife wanted to thank Dr. Dulce Sellar and staff for the wonderful care provided.

## 2023-08-01 ENCOUNTER — Ambulatory Visit: Payer: Medicare HMO | Admitting: Cardiology

## 2023-08-17 DEATH — deceased
